# Patient Record
Sex: Female | Born: 1945
Health system: Southern US, Community
[De-identification: ages and names within clinical notes are randomized; demographics above are authoritative.]

## PROBLEM LIST (undated history)

## (undated) DIAGNOSIS — E119 Type 2 diabetes mellitus without complications: Secondary | ICD-10-CM

## (undated) DIAGNOSIS — F419 Anxiety disorder, unspecified: Secondary | ICD-10-CM

## (undated) DIAGNOSIS — IMO0002 Reserved for concepts with insufficient information to code with codable children: Secondary | ICD-10-CM

## (undated) DIAGNOSIS — I10 Essential (primary) hypertension: Secondary | ICD-10-CM

## (undated) DIAGNOSIS — K859 Acute pancreatitis without necrosis or infection, unspecified: Secondary | ICD-10-CM

## (undated) DIAGNOSIS — E039 Hypothyroidism, unspecified: Secondary | ICD-10-CM

## (undated) DIAGNOSIS — K219 Gastro-esophageal reflux disease without esophagitis: Secondary | ICD-10-CM

## (undated) DIAGNOSIS — M1990 Unspecified osteoarthritis, unspecified site: Secondary | ICD-10-CM

## (undated) DIAGNOSIS — E785 Hyperlipidemia, unspecified: Secondary | ICD-10-CM

## (undated) HISTORY — DX: Hypothyroidism, unspecified: E03.9

## (undated) HISTORY — DX: Anxiety disorder, unspecified: F41.9

## (undated) HISTORY — DX: Gastro-esophageal reflux disease without esophagitis: K21.9

## (undated) HISTORY — DX: Reserved for concepts with insufficient information to code with codable children: IMO0002

## (undated) HISTORY — PX: COLONOSCOPY: SHX174

## (undated) HISTORY — DX: Unspecified osteoarthritis, unspecified site: M19.90

## (undated) HISTORY — DX: Essential (primary) hypertension: I10

## (undated) HISTORY — DX: Hyperlipidemia, unspecified: E78.5

## (undated) HISTORY — PX: BREAST CYST EXCISION: SHX579

## (undated) HISTORY — PX: POLYPECTOMY: SHX149

## (undated) HISTORY — DX: Acute pancreatitis without necrosis or infection, unspecified: K85.90

## (undated) HISTORY — DX: Type 2 diabetes mellitus without complications: E11.9

---

## 1982-06-08 HISTORY — PX: TOTAL ABDOMINAL HYSTERECTOMY: SHX209

## 1993-06-08 HISTORY — PX: ROTATOR CUFF REPAIR: SHX139

## 1999-06-08 ENCOUNTER — Emergency Department (HOSPITAL_COMMUNITY): Admission: EM | Admit: 1999-06-08 | Discharge: 1999-06-08 | Payer: Self-pay | Admitting: Emergency Medicine

## 1999-07-10 ENCOUNTER — Other Ambulatory Visit: Admission: RE | Admit: 1999-07-10 | Discharge: 1999-07-10 | Payer: Self-pay | Admitting: Gynecology

## 1999-08-06 ENCOUNTER — Encounter: Payer: Self-pay | Admitting: Cardiology

## 1999-08-06 ENCOUNTER — Encounter: Admission: RE | Admit: 1999-08-06 | Discharge: 1999-08-06 | Payer: Self-pay | Admitting: Cardiology

## 2001-06-23 ENCOUNTER — Encounter: Admission: RE | Admit: 2001-06-23 | Discharge: 2001-06-23 | Payer: Self-pay | Admitting: Internal Medicine

## 2001-06-23 ENCOUNTER — Encounter: Payer: Self-pay | Admitting: Internal Medicine

## 2002-03-13 ENCOUNTER — Encounter: Payer: Self-pay | Admitting: Internal Medicine

## 2002-07-06 ENCOUNTER — Encounter: Admission: RE | Admit: 2002-07-06 | Discharge: 2002-10-04 | Payer: Self-pay | Admitting: Internal Medicine

## 2003-10-15 ENCOUNTER — Other Ambulatory Visit: Admission: RE | Admit: 2003-10-15 | Discharge: 2003-10-15 | Payer: Self-pay | Admitting: Gynecology

## 2005-10-21 ENCOUNTER — Ambulatory Visit: Payer: Self-pay | Admitting: Internal Medicine

## 2006-11-08 ENCOUNTER — Ambulatory Visit: Payer: Self-pay | Admitting: Internal Medicine

## 2006-11-18 ENCOUNTER — Ambulatory Visit: Payer: Self-pay | Admitting: Internal Medicine

## 2006-11-18 ENCOUNTER — Encounter: Payer: Self-pay | Admitting: Internal Medicine

## 2006-11-24 ENCOUNTER — Encounter (INDEPENDENT_AMBULATORY_CARE_PROVIDER_SITE_OTHER): Payer: Self-pay | Admitting: *Deleted

## 2006-11-24 ENCOUNTER — Inpatient Hospital Stay (HOSPITAL_COMMUNITY): Admission: EM | Admit: 2006-11-24 | Discharge: 2006-12-01 | Payer: Self-pay | Admitting: Emergency Medicine

## 2006-12-01 ENCOUNTER — Ambulatory Visit: Payer: Self-pay | Admitting: Internal Medicine

## 2006-12-07 ENCOUNTER — Encounter: Payer: Self-pay | Admitting: Internal Medicine

## 2006-12-22 ENCOUNTER — Encounter (INDEPENDENT_AMBULATORY_CARE_PROVIDER_SITE_OTHER): Payer: Self-pay | Admitting: *Deleted

## 2006-12-29 ENCOUNTER — Ambulatory Visit: Payer: Self-pay | Admitting: Internal Medicine

## 2006-12-29 LAB — CONVERTED CEMR LAB: Magnesium: 1.9 mg/dL (ref 1.5–2.5)

## 2006-12-30 ENCOUNTER — Ambulatory Visit: Payer: Self-pay | Admitting: Cardiology

## 2007-04-25 ENCOUNTER — Ambulatory Visit: Payer: Self-pay | Admitting: Internal Medicine

## 2007-04-25 LAB — CONVERTED CEMR LAB
BUN: 12 mg/dL (ref 6–23)
Creatinine, Ser: 0.7 mg/dL (ref 0.4–1.2)

## 2007-04-28 ENCOUNTER — Ambulatory Visit: Payer: Self-pay | Admitting: Cardiology

## 2007-09-21 DIAGNOSIS — Z862 Personal history of diseases of the blood and blood-forming organs and certain disorders involving the immune mechanism: Secondary | ICD-10-CM

## 2007-09-21 DIAGNOSIS — K219 Gastro-esophageal reflux disease without esophagitis: Secondary | ICD-10-CM

## 2007-09-21 DIAGNOSIS — Z8719 Personal history of other diseases of the digestive system: Secondary | ICD-10-CM | POA: Insufficient documentation

## 2007-09-21 DIAGNOSIS — I10 Essential (primary) hypertension: Secondary | ICD-10-CM | POA: Insufficient documentation

## 2007-09-21 DIAGNOSIS — E669 Obesity, unspecified: Secondary | ICD-10-CM

## 2007-09-21 DIAGNOSIS — M353 Polymyalgia rheumatica: Secondary | ICD-10-CM | POA: Insufficient documentation

## 2007-09-21 DIAGNOSIS — Z8639 Personal history of other endocrine, nutritional and metabolic disease: Secondary | ICD-10-CM | POA: Insufficient documentation

## 2007-09-21 DIAGNOSIS — K449 Diaphragmatic hernia without obstruction or gangrene: Secondary | ICD-10-CM | POA: Insufficient documentation

## 2007-09-22 ENCOUNTER — Encounter: Payer: Self-pay | Admitting: Internal Medicine

## 2007-11-18 ENCOUNTER — Telehealth: Payer: Self-pay | Admitting: Internal Medicine

## 2007-11-21 ENCOUNTER — Telehealth: Payer: Self-pay | Admitting: Internal Medicine

## 2007-11-21 DIAGNOSIS — K862 Cyst of pancreas: Secondary | ICD-10-CM

## 2007-11-21 DIAGNOSIS — K863 Pseudocyst of pancreas: Secondary | ICD-10-CM

## 2007-11-22 ENCOUNTER — Ambulatory Visit: Payer: Self-pay | Admitting: Internal Medicine

## 2007-11-22 LAB — CONVERTED CEMR LAB: Creatinine, Ser: 0.8 mg/dL (ref 0.4–1.2)

## 2007-11-24 ENCOUNTER — Ambulatory Visit: Payer: Self-pay | Admitting: Cardiology

## 2008-04-03 ENCOUNTER — Ambulatory Visit (HOSPITAL_COMMUNITY): Admission: RE | Admit: 2008-04-03 | Discharge: 2008-04-03 | Payer: Self-pay | Admitting: Surgery

## 2008-04-03 ENCOUNTER — Encounter (INDEPENDENT_AMBULATORY_CARE_PROVIDER_SITE_OTHER): Payer: Self-pay | Admitting: Surgery

## 2008-11-20 ENCOUNTER — Emergency Department (HOSPITAL_COMMUNITY): Admission: EM | Admit: 2008-11-20 | Discharge: 2008-11-21 | Payer: Self-pay | Admitting: Emergency Medicine

## 2008-11-21 ENCOUNTER — Telehealth: Payer: Self-pay | Admitting: Internal Medicine

## 2008-11-22 ENCOUNTER — Ambulatory Visit: Payer: Self-pay | Admitting: Internal Medicine

## 2008-11-23 ENCOUNTER — Inpatient Hospital Stay (HOSPITAL_COMMUNITY): Admission: AD | Admit: 2008-11-23 | Discharge: 2008-11-25 | Payer: Self-pay | Admitting: Internal Medicine

## 2008-11-23 ENCOUNTER — Telehealth: Payer: Self-pay | Admitting: Internal Medicine

## 2008-11-23 ENCOUNTER — Ambulatory Visit: Payer: Self-pay | Admitting: Gastroenterology

## 2009-01-02 ENCOUNTER — Ambulatory Visit: Payer: Self-pay | Admitting: Internal Medicine

## 2009-01-07 IMAGING — CR DG CHEST 2V
2 series · 2 of 2 positions shown · non-contrast
Comparison: None.

CLINICAL DATA: Pancreatitis, shortness of breath.
 CHEST ? 2 VIEW:

[w chest pa]
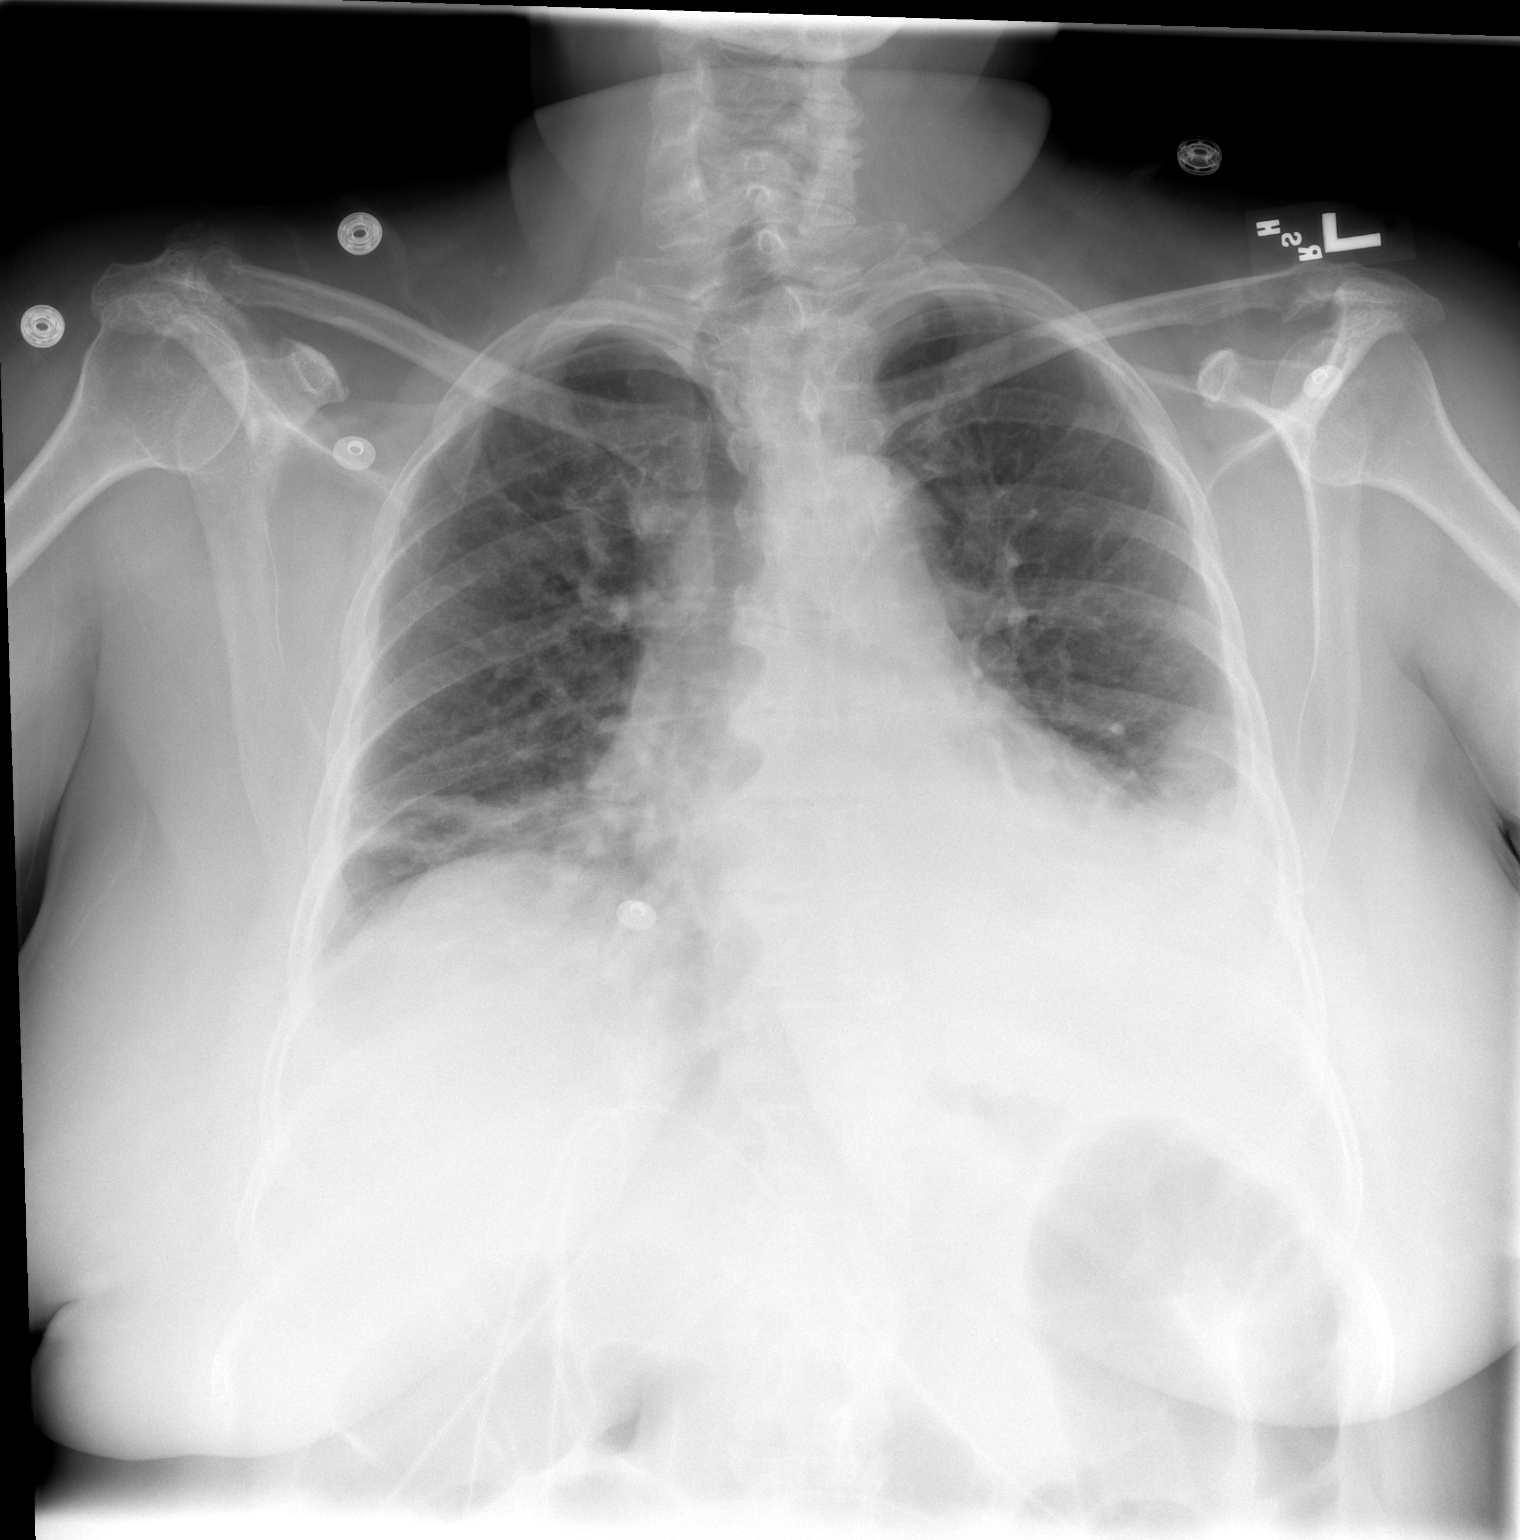

[w chest lat]
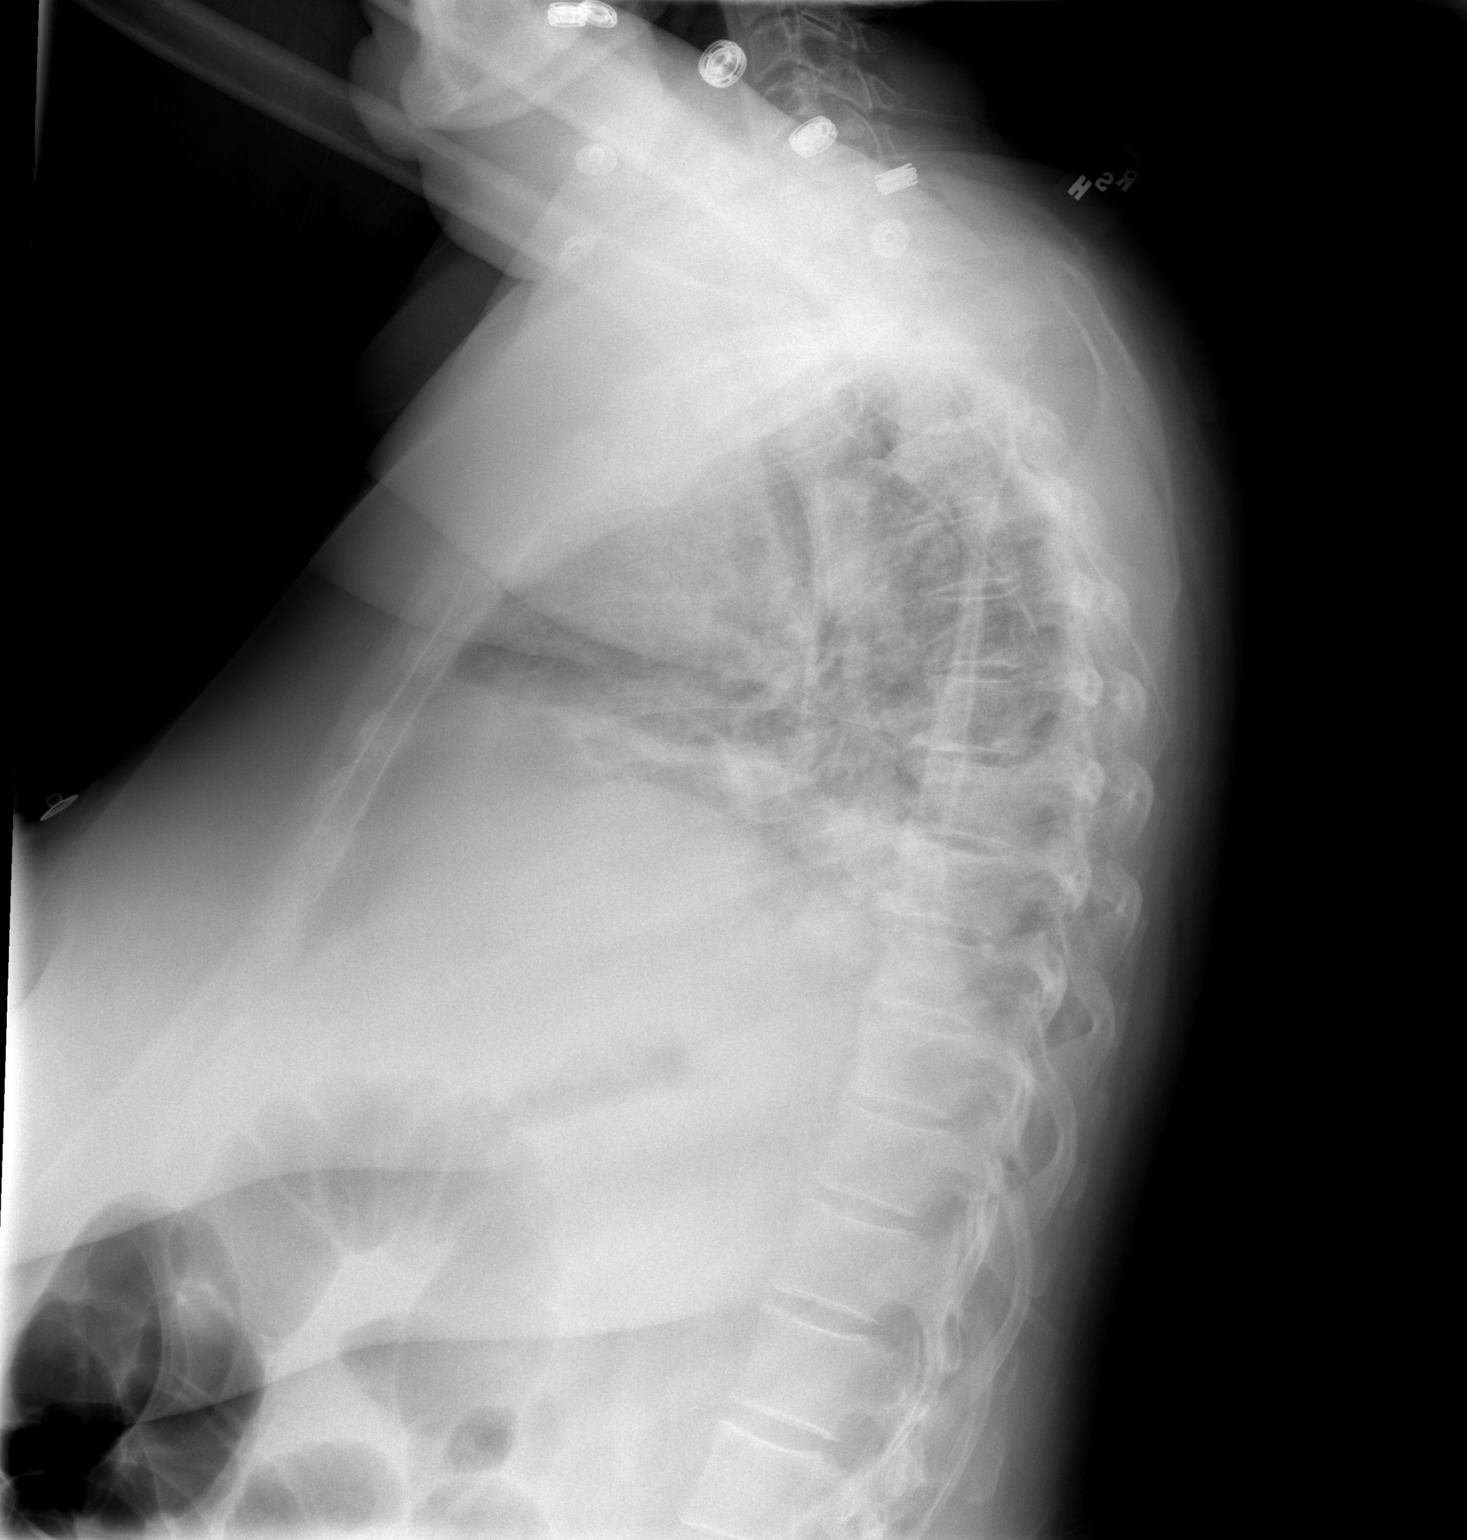

[2 of 2 positions shown; findings below may reference images not displayed]

FINDINGS: Heart size is at the upper limits of normal to mildly enlarged.  The lungs are very low in volume with bibasilar atelectasis and bilateral pleural effusions, left greater than right.
IMPRESSION: Low lung volumes with bibasilar atelectasis and bilateral pleural effusions, left greater than right.

## 2010-09-15 LAB — BASIC METABOLIC PANEL
BUN: 8 mg/dL (ref 6–23)
CO2: 31 mEq/L (ref 19–32)
Calcium: 9.8 mg/dL (ref 8.4–10.5)
Chloride: 101 mEq/L (ref 96–112)
Creatinine, Ser: 0.65 mg/dL (ref 0.4–1.2)
GFR calc Af Amer: >60 mL/min (ref >60)

## 2010-09-15 LAB — CBC
MCHC: 34.7 g/dL (ref 30.0–36.0)
MCV: 95.4 fL (ref 78.0–100.0)
Platelets: 246 10*3/uL (ref 150–400)
Platelets: 268 10*3/uL (ref 150–400)
RBC: 4.53 MIL/uL (ref 3.87–5.11)
RDW: 13.1 % (ref 11.5–15.5)
WBC: 9.3 10*3/uL (ref 4.0–10.5)

## 2010-09-15 LAB — COMPREHENSIVE METABOLIC PANEL
ALT: 12 U/L (ref 0–35)
AST: 14 U/L (ref 0–37)
Albumin: 3.3 g/dL — ABNORMAL LOW (ref 3.5–5.2)
Alkaline Phosphatase: 62 U/L (ref 39–117)
BUN: 8 mg/dL (ref 6–23)
Chloride: 104 mEq/L (ref 96–112)
GFR calc Af Amer: >60 mL/min (ref >60)
Potassium: 3.9 mEq/L (ref 3.5–5.1)
Sodium: 139 mEq/L (ref 135–145)
Total Bilirubin: 0.7 mg/dL (ref 0.3–1.2)
Total Protein: 7 g/dL (ref 6.0–8.3)

## 2010-09-15 LAB — DIFFERENTIAL
Basophils Absolute: 0.2 10*3/uL — ABNORMAL HIGH (ref 0.0–0.1)
Basophils Relative: 1 % (ref 0–1)
Eosinophils Absolute: 0.5 10*3/uL (ref 0.0–0.7)
Neutro Abs: 10.2 10*3/uL — ABNORMAL HIGH (ref 1.7–7.7)
Neutrophils Relative %: 69 % (ref 43–77)

## 2010-09-15 LAB — GLUCOSE, CAPILLARY
Glucose-Capillary: 106 mg/dL — ABNORMAL HIGH (ref 70–99)
Glucose-Capillary: 126 mg/dL — ABNORMAL HIGH (ref 70–99)
Glucose-Capillary: 88 mg/dL (ref 70–99)
Glucose-Capillary: 92 mg/dL (ref 70–99)
Glucose-Capillary: 94 mg/dL (ref 70–99)
Glucose-Capillary: 96 mg/dL (ref 70–99)
Glucose-Capillary: 97 mg/dL (ref 70–99)

## 2010-09-15 LAB — LIPASE, BLOOD: Lipase: 41 U/L (ref 11–59)

## 2010-09-15 LAB — HEMOGLOBIN A1C: Mean Plasma Glucose: 131 mg/dL

## 2010-09-15 LAB — AMYLASE: Amylase: 23 U/L — ABNORMAL LOW (ref 27–131)

## 2010-10-21 NOTE — Op Note (Signed)
NAMECOBY, [name] [name]             ACCOUNT NO.:  [id number]   MEDICAL RECORD NO.:  [id number]          PATIENT TYPE:  AMB   LOCATION:  SDS                          FACILITY:  [facility]   PHYSICIAN:  Thomas A. Cornett, M.D.DATE OF BIRTH:  [birth year]   DATE OF PROCEDURE:  04/03/2008  DATE OF DISCHARGE:  04/03/2008                               OPERATIVE REPORT   PREOPERATIVE DIAGNOSIS:  Left breast mass.   POSTOPERATIVE DIAGNOSIS:  Left breast mass.   ANESTHESIA:  Left breast needle-localized lumpectomy.   SURGEON:  Maisie Fus A. Cornett, MD   ANESTHESIA:  LMA, 0.25% skin local.   ESTIMATED BLOOD LOSS:  10 mL.   SPECIMEN:  Left breast tissue, localizing wire to pathology.   INDICATIONS FOR PROCEDURE:  The patient is a pleasant 65 year old female  with a left breast mass.  She underwent core biopsy of this, which  showed this to be a radial scar, but there was concern that this being  malignancy even with a core biopsy.  We talked about pros and cons of  observing this versus excising it.  The patient wished to have it  removed since it could not be a 100% this would be benign.   DESCRIPTION OF PROCEDURE:  The patient was brought to the operating room  after undergoing left breast needle localization.  After induction of  LMA anesthesia, left breast was prepped and draped in the sterile  fashion.  The wire site was trimmed.  A 0.25% Sensorcaine was injected  around the wire site and a horizontal incision was made on the medial  aspect of the lower quadrant.  Tissue was excised around the wire until  the tip was encountered.  The entire specimen was sent to the  radiologist.  We discussed the findings and since this was a very small  finding on mammography, we felt that we had an adequate biopsy at the  area we had which showed some architectural distortion similar to that  in the mammogram to the pathologist.  I could not feel anything in the  cavity that felt suspicious or worrisome.   The cavity was irrigated and  the wound was closed with a deep layer of 3-0 Vicryl and  subsequent 4-0 Monocryl layer.  Dermabond was applied.  All final counts  of sponge, needle, and instruments were found to be correct at this  portion of the case.  The patient was then awoken and taken to recovery  in satisfactory condition.      Thomas A. Cornett, M.D.  Electronically Signed     TAC/MEDQ  D:  04/03/2008  T:  04/04/2008  Job:  [id number]   cc:   Loraine Leriche A. Perini, M.D.  Dr. Karleen Hampshire

## 2010-10-21 NOTE — H&P (Signed)
[name], [name]             ACCOUNT NO.:  [id number]   MEDICAL RECORD NO.:  [id number]          PATIENT TYPE:  EMS   LOCATION:  MAJO                         FACILITY:  [facility]   PHYSICIAN:  Mark A. Perini, M.D.   DATE OF BIRTH:  1946-05-15   DATE OF ADMISSION:  11/24/2006  DATE OF DISCHARGE:                              HISTORY & PHYSICAL   CHIEF COMPLAINT:  Abdominal pain.   HISTORY OF PRESENT ILLNESS:  [name] is a pleasant 65 year old female  with past medical history as listed below.  She was in her usual state  of health until suddenly at 7:30 this morning, she had an onset of a  swollen feeling in her abdomen.  She had normal bowel movements this  morning.  She then developed severe epigastric and left upper quadrant  abdominal pain.  She took Gas-X with no relief and presented to the  emergency room at approximately noon.  She has had tight abdominal pain  in the midabdomen and left upper quadrant area which have persisted and  are severe.  She denies any nausea or vomiting.  She denies any blood  from above or below.  She denies any fevers or chills.  She denies any  recent gallbladder colic symptoms.  As of late, her polymyalgia  rheumatica symptoms have been fairly good, but she had some significant  right hip pain and is in the process having this evaluated.   She has had no new over-the-counter supplements or unusual medications  recently.   PAST HISTORY:  1. Upper endoscopy 6 days ago showing hiatal hernia and reflux      esophagitis.  2. Hysterectomy in 1984 without oophorectomy.  3. Hypertension since 1992.  4. Hypothyroidism since 1971.  5. Gastroesophageal reflux.  6. G1, P1 parity status.  7. Osteoarthritis.  8. Migraine headaches.  9. Type 2 diabetes diagnosed in the last year.  10.Degenerative joint disease and degenerative disk disease of the low      back with some right sciatica symptoms in the past.  11.Morbid obesity.  12.Polymyalgia rheumatica  diagnosed in February 2008.  13.Dyslipidemia.   ALLERGIES:  TOLECTIN causes a rash, CELEBREX caused GI upset, CRESTOR  caused bloating and cramps and ZOCOR caused muscle aches.   CURRENT MEDICATIONS:  1. Omeprazole 20 mg daily.  2. Premarin 0.625 mg daily.  3. Levoxyl 88 mcg daily.  4. Triamterene/hydrochlorothiazide 37.5/25 one daily.  5. Atenolol 50 mg daily.  6. Tylenol as needed.  7. Aspirin 81 mg daily.  8. Multivitamin daily.  9. Tramadol 50 mg occasionally.  10.Prednisone 5 mg twice a day.  11.Pravachol 40 mg nightly.  12.Fosamax 70 mg each week.  She had her first dose of Fosamax in the      last week.  13.Mirapex each evening.   SOCIAL HISTORY:  She had been widowed since 72.  She has 1 daughter,  [name] [name], at the bedside.  She had 3 grandson.  She works at [employer].  There  is no tobacco history.  She has no alcohol use and no drug use.   FAMILY HISTORY:  Father  died at age 20 of coronary disease and had a  bypass.  Mother is alive at age 40 and has a history of coronary  disease.  She is 2 brothers and a sister who are all living.  There is a  family history of diabetes and coronary disease as well.   REVIEW OF SYSTEMS:  As per the history of present illness.   PHYSICAL EXAM:  VITAL SIGNS:  Temperature 97.5, blood pressure 154/76,  pulse is 75, respiratory rate is 18 with 93% to 96% saturation on room  air.  GENERAL:  She is in no acute distress.  She is in pain.  She is alert  and oriented x4.  She is appropriate, obese.  SKIN:  There is no pallor or icterus.  LUNGS:  Clear to auscultation bilaterally with no wheezes, rales or  rhonchi.  HEART:  Regular rate and rhythm with no murmur, rub or gallop.  ABDOMEN:  Decreased bowel sounds.  There is tenderness in the epigastric  area with some mild guarding.  There is no rebound tenderness.  There is  no hepatosplenomegaly noted.  She is obese.  There is no edema.  She  moves extremities x4.   LABORATORY DATA:  CT  scan of the abdomen shows an inflammatory process  of the retroperitoneum most consistent with pancreatitis.  There is  inflammation and small pockets of fluid around the mid to distal  pancreas.  There is no necrosis and no abscess.  There is a mild small  bowel ileus at the jejunal level.  There is no free air.  There is  bibasilar atelectasis and scarring.  There is no definitive gallstone or  gallbladder disease or ductal dilatation seen on the CT scan.  Acute  abdominal series has a nonspecific bowel gas pattern.   White count is 17.4 with 89% segs, 9% lymphocytes, 2% monocytes;  hemoglobin is 14.2, platelet count 311,000.  Urinalysis is yellow and  hazy, 1.017 specific gravity, pH is 5.5 and there is moderate blood;  otherwise, dipstick is negative.  On the microscopic, there are 0-2  white cells, 0-2 red cells.  Lipase is 279.  Sodium 135, potassium 4.0,  chloride 100, CO2 25, BUN 11, creatinine 0.61, glucose 218, calcium 9.0,  albumin 3.2, total protein 6.5, AST 25, ALT 24.   ASSESSMENT AND PLAN:  Acute pancreatitis.  There is no alcohol history.  I suspect she may have passed a gallstone.  She is a obese, middle-aged  and has been on long-term hormone replacement therapy.  We will consult  Gastroenterology.  We will admit to a telemetry bed.  We will treat with  Lovenox for deep venous thrombosis prophylaxis.  We will treat with  empiric Zosyn, given her elevated white count.  We will hydrate with  normal saline.  We will give morphine as needed.  We will give her  intravenous corticosteroids and intravenous thyroid  replacement.  We will defer other workup to Gastroenterology; however,  we will add on an LDH level and an abdominal ultrasound to look more  closely for gallstones and to look at the bile ducts more carefully.  Current status is satisfactory.  She is a full code status.           ______________________________ Redge Gainer Waynard Edwards, M.D.     MAP/MEDQ  D:   11/24/2006  T:  11/25/2006  Job:  [id number]   cc:   Hedwig Morton. Juanda Chance, MD  Demetria Pore. Coral Spikes, M.D.

## 2010-10-21 NOTE — H&P (Signed)
NAMEKIELI, [name]             ACCOUNT NO.:  [id number]   MEDICAL RECORD NO.:  [id number]          PATIENT TYPE:  INP   LOCATION:  5501                         FACILITY:  [facility]   PHYSICIAN:  Rachael Fee, MD   DATE OF BIRTH:  February 05, 1946   DATE OF ADMISSION:  11/23/2008  DATE OF DISCHARGE:                              HISTORY & PHYSICAL   REASON FOR ADMISSION:  Abdominal pain and CT scan showing pancreatitis.   HISTORY OF PRESENT ILLNESS:  [name] is a 65 year old white  female.  In June 2008, she had an episode of acute pancreatitis that was  felt secondary to hydrochlorothiazide.  At that time, CT as well as  ultrasound imaging did not show any biliary ductal or gallbladder  pathology.  Followup CT of July 2008 and again in November 2008 did show  a small pseudocyst, but this was resolving by the time of the bladder CT  scan.   Since Monday, November 19, 2008, the patient has been experiencing pain in  the epigastrium and left upper quadrant pain.  It progressed over the  next 24 hours and she went to the emergency room on Tuesday evening  where she received relief from a GI cocktail and a 2 mg dose of  Dilaudid.  Labs did not include liver tests, but her basic metabolic  panel, lipase were both normal.  White blood cell count was somewhat  elevated at 14.5.  The patient was released with a prescription for  Percocet as needed and Carafate four times daily.  The patient's  symptoms have progressed.  She is getting slight relief for brief  periods of time from the Percocet, but overall she is not feeling well.  She has, however, not had any nausea or vomiting nor she had any fevers.  She was in contact with Dr. Regino Schultze office and had a CT scan done as an  outpatient yesterday.  This is showing some mild pancreatitis at the  uncinate process and head of the pancreas.  Because her symptoms were  not improving, it was appropriate to have her admitted for supportive  care  to the hospital here at [facility].   Notably the patient takes three medications which are listed as  potential causes of pancreatitis.  These include omeprazole, benazepril,  and pravastatin.  The pravastatin is more suspicious because about 3  weeks ago, during her routine office visit with Dr. Waynard Edwards, the  pravastatin was increased from 40 mg daily to 80 mg daily.   PAST MEDICAL HISTORY:  1. Gastroesophageal reflux disease.  2. Hypertension.  3. Hypothyroidism.  4. Acute pancreatitis, felt secondary to hydrochlorothiazide in June      2008.  She did have a pseudocyst measuring 1.2 cm in July 2010, it      was decreasing in size by November 2010.  5. Morbid obesity.  6. Fatty liver.  7. Ileus at the time of the pancreatitis.  8. Benign breast mass.  She had a lumpectomy in October 2009.  The      pathology showed focal atypical lobular hyperplasia.  9. Migraine  headaches.  10.Osteoarthritis.  11.Osteoporosis.  12.Polymyalgia rheumatica.  13.Dyslipidemia.  14.Status post right rotator cuff repair remotely.  15.Status post hysterectomy, her ovaries are intact.  16.Gravida 1, para 1.   CURRENT MEDICATIONS:  1. Aspirin 81 mg p.o. daily.  2. Omeprazole 20 mg p.o. daily.  3. Atenolol 50 mg p.o. daily.  4. Benazepril 20 mg p.o. daily.  5. Levothyroxine 88 mcg p.o. daily.  6. Pravastatin 80 mg p.o. daily.  7. Multivitamin once p.o. daily.  8. Aleve as needed.  She takes anywhere from 2-4 pills a week of this.  9. Tylenol Extra Strength up to four times daily.  10.Carafate 1 g p.o. q.i.d., this was just started a couple of days      ago.  11.Percocet 5/325 mg one p.o. q.4-6 h. p.r.n. pain.  This was just      prescribed 2 days ago.   ALLERGIES:  Several including PENICILLIN and MORPHINE and TOLECTIN, all  of which caused rash.  She is intolerant to CELEBREX which caused GI  upset, to CRESTOR which causes bloating and abdominal cramps.  ZOCOR has  caused muscle aches.   HYDROCHLOROTHIAZIDE was felt to be causative in  her pancreatitis in the past.   FAMILY HISTORY:  Father died young at 4.  He had coronary artery  disease and had undergone CABG.  Her mother is also status post CABG.  She is alive at age 1.  One brother has diabetes mellitus, coronary  artery disease, and has undergone CABG.  Mother also had breast cancer  for which she is status post lumpectomy and has had cataract surgery.   SOCIAL HISTORY:  The patient is a widow.  She does not use tobacco  products, alcohol, or illicit drugs.  She lives in [place].  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   REVIEW OF SYSTEMS:  CONSTITUTIONAL:  The patient says her weight  fluctuates around 15 pounds within the time period of several months.  Her weight for comparison in October 2009 was 94.6 kg.  EYES:  She does  not have cataracts nor glaucoma, does not have blurry vision.  She had a  dilated eye exam within last 6 months.  NEUROLOGIC:  She does get  migraines and had one yesterday.  She treats these with p.r.n. Tylenol.  She denies dizziness or gait disorder.  ENT:  No sinus congestion.  No  mouth sores.  No dental problems.  CARDIOVASCULAR:  No chest pain.  No  palpitations.  No history of coronary artery disease.  PULMONARY:  No  shortness of breath.  No cough.  GI:  If she does not take her  omeprazole, she started to get sour acid belching, but does not get  pyrosis.  She has no dysphagia.  Bowel movement, she had a good bowel  movement on Tuesday and had a small bowel movement today.  GU:  She does  not have any dysuria, urinary frequency, or incontinence.  MUSCULOSKELETAL:  The polymyalgia rheumatica mostly effects the arms and  she does get aches there with some frequency.  DERMATOLOGIC:  No rashes.  No pruritus.  PSYCHIATRIC:  No depression.  No mood swings.  ENDOSCOPY:  Blood sugars are monitored at least once daily and they run as low as  100 and up as high as 150.   She has never been told that she needed to  start on any oral agents.  ID:  The patient denies fever or chills.  HEMATOLOGIC:  The patient has no problems with inappropriate bleeding or  bruising and has never required transfusions.  IMMUNE/ALLERGIES:  The  patient had a 2009 seasonal flu shot.  She is not clear when she last  had a tetanus, she thinks it may have been at least 20 years ago.  All  other reviews of systems is negative.  Prior GI studies include  colonoscopy in October 2003.  This was a routine study and was normal.  Upper endoscopy in June 2008, she had a hiatal hernia, grade 0.  Gastroesophageal reflux/esophagitis was noted.   LABORATORY:  Pending labs were today, but the labs from November 21, 2008  showed a lipase of 41.  Glucose of 175.  BUN 8, creatinine 0.6.  Sodium  139, potassium 4.0.  Hemoglobin was 15, hematocrit 43.3, white blood  cell count 14.9, platelet count 268.  MCV 95.  CT scan of the abdomen  and pelvis on November 22, 2008 showed mild pancreatitis at the head and the  uncinate process.  The gallbladder and liver were described as normal.   PHYSICAL EXAMINATION:  GENERAL:  The patient is obese.  She looks well.  She is not in any acute distress.  She is somewhat older white female.  VITAL SIGNS:  Temperature is 96.8, pulse is 63, respirations are 18,  blood pressure 139/70, room air saturation is 96%.  HEENT:  There is no conjunctival pallor.  There is no icterus.  Oral  mucosa is clear and moist.  Her dentition is in good repair.  NECK:  No masses, no JVD.  PULMONARY:  The lungs are clear to auscultation and percussion  bilaterally.  No shortness of breath.  No cough.  CARDIOVASCULAR:  Regular rate and rhythm.  No murmurs, rubs, or gallops  appreciated.  S1 and S2 are audible.  ABDOMEN:  Soft, obese.  Bowel sounds are active.  There is some  tenderness in the left upper quadrant and epigastrium.  No masses, no  guarding, or rebound here.  There is no  hepatosplenomegaly.  EXTREMITIES:  No cyanosis, clubbing, or edema.  DERMATOLOGIC:  No rash.  No jaundice.  NEUROLOGIC:  She is alert and oriented x3.  Strength in the upper and  lower extremities is full.  There is no tremors.   IMPRESSION:  1. Recurrent acute pancreatitis, the patient is stable.  This does not      appear to be a severe case of pancreatitis.  Question if the      pravastatin is the cause as the dose was recently doubled.  Other      possible culprits for medication induced pancreatitis include      benazepril and omeprazole.  On CT scan 2 days ago as well as      ultrasound and CT imaging in 2008, there was no evidence of any      biliary pathology.  2. Diabetes mellitus type 2.  She has a history of elevated blood      sugars, but has never been treated with oral medications.  Says      that her hemoglobin A1c has always been acceptable.  3. Hyperlipidemia.  4. Polymyalgia rheumatica.  Dr. Coral Spikes discontinued her steroids as      of March 2010.  5. Hypertension, controlled.   PLAN:  The patient admitted to inpatient bed for supportive care, sips  of water for now.  We will provide Dilaudid and Phenergan as needed for  her symptoms.  Give her IV fluids.  Provide sliding scale insulin for  p.r.n. use.  Repeat a full set of chemistries, pancreatic enzymes, and  CBC.      Jennye Moccasin, PA-C      Rachael Fee, MD  Electronically Signed    SG/MEDQ  D:  11/23/2008  T:  11/24/2008  Job:  [id number]

## 2010-10-21 NOTE — Assessment & Plan Note (Signed)
[facility]                         GASTROENTEROLOGY OFFICE NOTE   [name] [name], [name] [name]                    MRN:          [id number]  DATE:12/29/2006                            DOB:          August 29, 1945    Ms. [name] [name] is a 65 year old patient of Dr. Laurey Morale who had an acute  pancreatitis requiring hospitalization at [facility].  She was  discharged 2 weeks ago.  Pancreatitis occurs within a week of her having  upper endoscopy for evaluation of distal esophageal reflux disease.  The  etiology of the pancreatitis was never determined.  It was suspected  that it was due to thiazide diuretics.  Her gallbladder appears normal  and she has never had any significant alcohol intake.  Also, her lipids  were not excessively high.  Since the hospitalization, she has done  quite well and has been able to return to work.  She still has  occasional epigastric discomfort and most of her complaints now center  around constipation.  She had normal bowel habits prior to the  hospitalization, but now has somewhat difficult evacuations.   MEDICATIONS:  1. Magnesium 400 mg daily.  2. Potassium 1 daily.  3. Prednisone 5 mg p.o. daily.  4. Atenolol 40 mg p.o. daily.  5. Premarin 0.625 mg p.o. daily.  6. Omeprazole 20 mg p.o. daily.  7. Benazepril 20 mg p.o. daily.  8. Mirapex 5 mg p.o. daily.  9. Pravastatin 20 mg p.o. daily.  10.Baby aspirin 81 mg p.o. daily.  11.Levoxyl 20 mcg daily.   PHYSICAL EXAM:  Blood pressure 164/98, pulse 68, weight not taken.  She was alert and oriented, in no distress, overweight.  Last weight in  our office was 206 pounds.  LUNGS:  Clear to auscultation.  COR:  Normal S1, normal S2.  ABDOMEN:  Obese, soft.  Tenderness in the epigastrium to the left of the  epigastrium.  No fullness or pulsation.  Lower abdomen was unremarkable.  EXTREMITIES:  No edema.   IMPRESSION:  17. A 65 year old white female status post acute pancreatitis  with      pancreatic edema on CT scan.  Might have been attributable to      thiazide diuretics.  She has symptomatically improved.  2. Polymyalgia rheumatica by history.  The patient on low-dose      steroids.  3. High blood pressure.  4. History of hypothyroidism.  5. Obesity.  6. Gastroesophageal reflux disease.   PLAN:  1. Repeat CT scan of the abdomen with pancreatic protocol to rule out      the evolution of pseudo-cyst.  2. Colace 100 mg daily.  3. Check magnesium potassium today.  The patient is interested in      discontinuation of these medications if she does not need it.  4. Continue Prilosec 200 mg daily.  5. Continue low-fat diet.     Hedwig Morton. Juanda Chance, MD  Electronically Signed    DMB/MedQ  DD: 12/29/2006  DT: 12/29/2006  Job #: [id number]   cc:   Loraine Leriche A. Perini, M.D.

## 2010-10-24 NOTE — Discharge Summary (Signed)
NAMEJOSCELYNN, BRUTUS             ACCOUNT NO.:  [id number]   MEDICAL RECORD NO.:  [id number]          PATIENT TYPE:  INP   LOCATION:  4705                         FACILITY:  [facility]   PHYSICIAN:  Mark A. Perini, M.D.   DATE OF BIRTH:  1946/04/03   DATE OF ADMISSION:  11/24/2006  DATE OF DISCHARGE:  12/01/2006                               DISCHARGE SUMMARY   DISCHARGE DIAGNOSIS:  1. Acute pancreatitis felt to possibly be due to thiazide diuretic      use.  2. Ileus, resolving at the time of discharge.  3. Hypertension.  4. Hypothyroidism.  5. Gastroesophageal reflux.  6. Osteoarthritis.  7. Polymyalgia rheumatica on prednisone treatment for the last several      months.  8. Morbid obesity.  9. Dyslipidemia.  10.Type 2 diabetes.   PROCEDURES:  1. Gastroenterology consultation.  2. CT scan of the abdomen and pelvis on the day of admission which      showed inflammatory process of the retroperitoneum, most consistent      with pancreatitis.  There was inflammation and small pockets of      fluid around the mid-to-distal pancreas, no necrosis, no abscess      seen.  There was a mild small-bowel ileus of the jejunal level      noted.  There was no free air.  There was bibasilar atelectasis and      scarring.  No definitive gallstone or gallbladder disease or ductal      dilation was seen.  3. Abdominal ultrasound November 25, 2006 showed no gallstones, fatty      infiltration of the liver.  The pancreas and abdominal aorta were      obscured by bowel gas and body habitus.   DISCHARGE MEDICATIONS:  1. She is to discontinue hydrochlorothiazide and to take no more      triamterene hydrochlorothiazide.  2. She is to resume omeprazole 20 mg daily with food.  3. She may resume Premarin 0.625 mg once daily in 1 week.  4. She is to resume Levoxyl 80 mcg daily.  5. Atenolol 50 mg daily.  6. Tylenol 500 mg 1-2 tablets three times a day as needed.  7. Aspirin 81 mg daily.  8. She is to  stop her multivitamin.  9. She is stop Tramadol.  10.She is to take prednisone 5 mg once daily.  11.Pravachol 40 mg each evening may be resumed in 1 week.  12.In two weeks she may resume Fosamax 70 mg once weekly as directed.  13.It is okay to take Mirapex if needed.  14.She is to have Vicodin 5/500 one q.6 h. as needed for pain.  15.She is to use a stool softener if she uses Vicodin.  16.She is to take magnesium supplement 400 mg daily.  17.Potassium 20 mEq 1 pill daily.   HISTORY OF PRESENT ILLNESS:  Shawan is a pleasant 65 year old female who  presented on November 22, 2006 with acute onset of severe mid-and-upper  abdominal pain.  She was found to have pancreatitis and was admitted.   HOSPITAL COURSE:  Delayla was admitted to a  telemetry bed.  She was  treated with aggressive IV hydration.  She did receive 1 day of IV  antibiotics; and then this was discontinued.  She was treated bowel  rest.  She had significant hypokalemia which required replacement.  She  did have a PICC line placed; and this was removed at the time of  discharge.  She never required a TNA.  Gastroenterology followed along  in her case.  As she had no significant abnormality of her liver  function test, and her gallbladder appeared normal on both CT scan  abdomen, and she has no alcohol history; it was felt that most likely  her pancreatitis was due to thiazide diuretic use.  Therefore, this was  discontinued up.  Ms. Dillavou pain gradually improved; and by December 01, 2006 she was deemed stable for discharge home.   DISCHARGE PHYSICAL EXAM:  Temperature 98.1.  She was afebrile, pulse 63,  respiratory 18, blood pressure 173/84 and 131/82.  Blood sugars ranged  from 137-227.  Oxygen saturation 96% on room air.  Weight was 101.5 kg.  She was in no acute distress.  Lungs were clear to auscultation  bilaterally with no wheezes, rales, or rhonchi.  Heart was regular,  rate, and rhythm with no murmur, rub, or gallop.   Abdomen had positive  bowel sounds.  It was soft; and there was very mild epigastric  tenderness noted.  There was no rebound or guarding.  There was no  peripheral edema.   DISCHARGE LABORATORY DATA:  White count 12.1 with 60% segs, 31%  lymphocytes, 6% monocytes, hemoglobin 11.4, platelet count 307,000,  sodium 142, potassium 3.0, chloride 105, CO2 29, BUN 4, creatinine 0.64,  glucose 126, albumin 2.2 with other liver tests normal.   DISCHARGE INSTRUCTIONS:  Areej is to follow a bland, diabetic, low-salt  diet.  She is to increase her activity slowly.  She is to call if she  has any problems.  She is to followup in 7 days with Dr. Waynard Edwards; and she  is return to [facility] medical lab in 2 days for a followup CBC and BMET.  She is to check her blood sugars twice a day before meals; and keep a  record of this; and call us if her sugars are significantly over 180-200  consistently.  She is to follow up with [facility] gastroenterology; and  she is to call for a visit in the next 3-4 weeks.           ______________________________  Redge Gainer Waynard Edwards, M.D.     MAP/MEDQ  D:  12/07/2006  T:  12/07/2006  Job:  [id number]   cc:   Hedwig Morton. Juanda Chance, MD

## 2010-10-24 NOTE — Discharge Summary (Signed)
[name] [name], [name]             ACCOUNT NO.:  [id number]   MEDICAL RECORD NO.:  [id number]          PATIENT TYPE:  INP   LOCATION:  5501                         FACILITY:  [facility]   PHYSICIAN:  Rachael Fee, MD   DATE OF BIRTH:  July 22, 1945   DATE OF ADMISSION:  11/23/2008  DATE OF DISCHARGE:  11/25/2008                               DISCHARGE SUMMARY   ADMITTING DIAGNOSES:  1. Recurrent acute pancreatitis.  The patient is stable.  Suspicion      that recent increase in pravastatin dose may have led to      pancreatitis.  Other medication culprits for pancreatitis include      benazepril and omeprazole which the patient is taking.  2. Type 2 diabetes mellitus, non-insulin requiring  3. Hyperlipidemia.  4. Polymyalgia rheumatica.  She has not been on steroid therapy since      March 2010.  5. Controlled hypertension.  6. Hypothyroidism.  7. History of acute pancreatitis in June 2008.  Etiology was felt to      be hydrochlorothiazide.  She had a pseudocyst at that time.  8. Morbid obesity.  9. Fatty liver.  10.Ileus associated with the 2008 bout of pancreatitis.  11.Status post lumpectomy, October 2009.  Pathology showing focal      atypical lobular hyperplasia.  12.Migraine headaches.  13.Osteoarthritis.  14.Osteoporosis.  15.Dyslipidemia.  16.Status post right rotator cuff repair.  17.Status post hysterectomy, ovaries are intact.  18.Gravida 1, para 1.   Allergies to PENICILLIN, MORPHINE, TOLECTIN, CELEBREX, CRESTOR,  HYDROCHLOROTHIAZIDE.  Some of these are true allergies.  She developed  rash from penicillin, morphine, and Tolectin.  Crestor caused bloating  and abdominal cramps.  Zocor called muscle aches and Celebrex caused GI  upset.  Hydrochlorothiazide was felt to be the cause for 2008 bout with  pancreatitis.   DISCHARGE DIAGNOSES:  1. Mild bout of acute pancreatitis.  Amylase and lipase normal, and      symptom of abdominal pain, resolving rapidly.  Question  as to      whether this was secondary to the recent increased dose of her      pravastatin.  2. Diabetes mellitus type 2.  Hemoglobin A1c measurements suggest fair      but chronic management of her blood sugars.  Blood sugar is fairly      well controlled here in the hospital.   PROCEDURE:  None.   HOSPITAL CONSULTS:  The patient was seen briefly by her primary care  doctor, Dr. Rodrigo Ran.  He agreed that the pravastatin should be  discontinued for this patient.   LABORATORY:  Hemoglobin 13.3, hematocrit 38.7.  White blood cell count  9.3.  MCV 96.8.  Platelets 246.  Sodium 139, potassium 3.9.  Chloride  104, CO2 29.  Glucose 97.  Creatinine 0.69.  Total bilirubin 7.0,  alkaline phosphatase 62, AST 14, ALT 12.  The amylase was 23 and the  lipase was 13.  Hemoglobin A1c 6.2 that representing an estimated  average glucose of 131.  The reference range for A1c is 4.6-6.1.  CBGs  ranging anywhere from  87-106.   IMAGING STUDIES:  CT scan of the abdomen without oral contrast showed  pancreatitis at the head and uncinate process.   BRIEF HISTORY:  [name] [name] is a pleasant 65 year old white female who  had prior acute pancreatitis that resolved in June 2008.  Last previous  imaging in November 2008 showed a small pseudocyst.  She had a  subsequent bladder scan in June 2009, but said that the pseudocyst was  virtually resolved and just measured about 7 x 4 mm at that time.   The patient presented to the emergency room on November 23, 2008.  She had  about 4 days of epigastric and left upper quadrant pain.  This had began  on November 19, 2008, she went to the emergency room on November 20, 2008, and  had relief after being given a GI cocktail and some Dilaudid.  Low labs  were obtained, and the lipase and BMET were normal.  She did not have  LFTs obtained.  White count was somewhat elevated at 14.5.  She was  given a prescription for Percocet and Carafate 4 times daily to be added  to her  numerous other medications which did include omeprazole and once  a day aspirin.  Since getting home, the patient has had recurrence of  the pain and it is not getting any better except for some brief relief  with the use of Percocet.  She denied nausea or vomiting.  She called  Dr. Juanda Chance and a outpatient CT scan was arranged showing some mild  pancreatitis.  Because of the non-improving symptoms, she was told to  come to the emergency room and was admitted to the hospital by Dr.  Rob Bunting.   Interesting to the history are the fact that she takes several  medications that are listed as potential causes for pancreatitis.  These  include omeprazole, benazepril, and pravastatin.  Pravastatin was  especially suspicious because it had been increased from 40-80 mg daily  3 weeks ago by her primary care physician.   HOSPITAL COURSE:  The patient was admitted and continued on IV fluids.  She was allowed a diet of clear liquids, and the diet was advanced to  low-fat diabetic-type diet and this she tolerated all levels of p.o.'s  well.  She was hospitalized for approximately 48 hours, and during that  time she had resolution of the abdominal pain.  She did not require  significant use of available Dilaudid.   The patient was followed over the weekend by Dr. Jeani Hawking, and she  was having no further significant pain and was discharged home in stable  condition.  She was advised to follow a diabetic diet.  The patient was  advised to continue all of her previous medications with the exception  of pravastatin.  Thus, her medications at discharge were:  1. Aspirin 81 mg daily.  2. Omeprazole 20 mg daily.  3. Atenolol 50 mg daily.  4. Benazepril 20 mg daily.  5. levothyroxine 88 mcg daily.  6. Multivitamins once daily.  7. Aleve p.r.n.  Of note, she takes about 2-4 of these pills weekly.  8. Tylenol Extra Strength up to 4 times daily.  9. Percocet 5/325 one p.o. q.4-6 h. p.r.n.  10.Note  that she had been started on Carafate just a couple of days      prior to admission and it was not necessary to continue this      particular medication.   She was to  follow up with Dr. Juanda Chance on January 02, 2009.      Jennye Moccasin, PA-C      Rachael Fee, MD  Electronically Signed    SG/MEDQ  D:  02/05/2009  T:  02/06/2009  Job:  [id number]   cc:   Loraine Leriche A. Perini, M.D.  Demetria Pore. Coral Spikes, M.D.  Hedwig Morton. Juanda Chance, MD

## 2011-03-09 LAB — BASIC METABOLIC PANEL
BUN: 10
GFR calc non Af Amer: >60
Glucose, Bld: 117 — ABNORMAL HIGH
Potassium: 3.8

## 2011-03-09 LAB — CBC
HCT: 43
Platelets: 253
RDW: 12.8

## 2011-03-09 LAB — DIFFERENTIAL
Basophils Absolute: 0
Eosinophils Absolute: 0.3
Eosinophils Relative: 5
Lymphocytes Relative: 41

## 2011-03-09 LAB — GLUCOSE, CAPILLARY: Glucose-Capillary: 114 — ABNORMAL HIGH

## 2011-03-25 LAB — HEPATIC FUNCTION PANEL
ALT: 17
AST: 18
AST: 20
Albumin: 2.3 — ABNORMAL LOW
Albumin: 2.6 — ABNORMAL LOW
Albumin: 3 — ABNORMAL LOW
Alkaline Phosphatase: 55
Alkaline Phosphatase: 58
Bilirubin, Direct: 0.1
Bilirubin, Direct: 0.2
Bilirubin, Direct: 0.2
Indirect Bilirubin: 0.5
Indirect Bilirubin: 1.1 — ABNORMAL HIGH
Total Bilirubin: 0.7
Total Bilirubin: 1.2
Total Protein: 5.9 — ABNORMAL LOW

## 2011-03-25 LAB — DIFFERENTIAL
Basophils Absolute: 0
Basophils Absolute: 0
Basophils Absolute: 0
Basophils Absolute: 0
Basophils Relative: 0
Basophils Relative: 0
Basophils Relative: 0
Basophils Relative: 0
Basophils Relative: 0
Basophils Relative: 0
Eosinophils Absolute: 0
Eosinophils Absolute: 0
Eosinophils Absolute: 0
Eosinophils Absolute: 0
Eosinophils Absolute: 0.1
Eosinophils Absolute: 0.3
Eosinophils Relative: 0
Eosinophils Relative: 1
Eosinophils Relative: 3
Lymphocytes Relative: 6 — ABNORMAL LOW
Lymphocytes Relative: 6 — ABNORMAL LOW
Lymphs Abs: 1
Lymphs Abs: 1.5
Lymphs Abs: 1.9
Lymphs Abs: 3.8 — ABNORMAL HIGH
Monocytes Absolute: 0.4
Monocytes Absolute: 0.6
Monocytes Absolute: 0.7
Monocytes Absolute: 1.2 — ABNORMAL HIGH
Monocytes Relative: 1 — ABNORMAL LOW
Monocytes Relative: 2 — ABNORMAL LOW
Monocytes Relative: 4
Monocytes Relative: 4
Monocytes Relative: 6
Neutro Abs: 10.1 — ABNORMAL HIGH
Neutro Abs: 13.8 — ABNORMAL HIGH
Neutro Abs: 17.5 — ABNORMAL HIGH
Neutrophils Relative %: 79 — ABNORMAL HIGH
Neutrophils Relative %: 88 — ABNORMAL HIGH
Neutrophils Relative %: 89 — ABNORMAL HIGH

## 2011-03-25 LAB — CBC
HCT: 33.7 — ABNORMAL LOW
HCT: 34.1 — ABNORMAL LOW
HCT: 36.3
HCT: 36.5
Hemoglobin: 11.6 — ABNORMAL LOW
Hemoglobin: 11.8 — ABNORMAL LOW
Hemoglobin: 12.4
Hemoglobin: 12.6
MCHC: 33.9
MCHC: 34.5
MCHC: 34.6
MCV: 95.8
MCV: 96
Platelets: 311
Platelets: 325
RBC: 3.52 — ABNORMAL LOW
RBC: 3.55 — ABNORMAL LOW
RBC: 3.57 — ABNORMAL LOW
RBC: 3.78 — ABNORMAL LOW
RBC: 4.32
RDW: 13.1
RDW: 13.2
RDW: 13.2
RDW: 13.3
RDW: 13.4
WBC: 11.5 — ABNORMAL HIGH
WBC: 13.9 — ABNORMAL HIGH
WBC: 15.4 — ABNORMAL HIGH

## 2011-03-25 LAB — BASIC METABOLIC PANEL
BUN: 5 — ABNORMAL LOW
CO2: 27
CO2: 29
Calcium: 8 — ABNORMAL LOW
Calcium: 8.3 — ABNORMAL LOW
Chloride: 101
Chloride: 105
Creatinine, Ser: 0.64
Creatinine, Ser: 0.75
GFR calc Af Amer: >60
GFR calc Af Amer: >60
GFR calc Af Amer: >60
GFR calc non Af Amer: >60
GFR calc non Af Amer: >60
Glucose, Bld: 136 — ABNORMAL HIGH
Glucose, Bld: 152 — ABNORMAL HIGH
Glucose, Bld: 160 — ABNORMAL HIGH
Potassium: 3 — ABNORMAL LOW
Potassium: 3.1 — ABNORMAL LOW
Sodium: 135
Sodium: 139
Sodium: 139
Sodium: 140

## 2011-03-25 LAB — COMPREHENSIVE METABOLIC PANEL
ALT: 16
ALT: 20
ALT: 24
AST: 22
Albumin: 3.2 — ABNORMAL LOW
Alkaline Phosphatase: 49
Alkaline Phosphatase: 53
Alkaline Phosphatase: 58
BUN: 5 — ABNORMAL LOW
CO2: 28
CO2: 29
Calcium: 8.3 — ABNORMAL LOW
Chloride: 107
GFR calc Af Amer: >60
Glucose, Bld: 169 — ABNORMAL HIGH
Potassium: 3 — ABNORMAL LOW
Potassium: 3.8
Potassium: 4
Sodium: 135
Sodium: 142
Sodium: 143
Total Bilirubin: 0.5
Total Protein: 4.9 — ABNORMAL LOW
Total Protein: 5 — ABNORMAL LOW
Total Protein: 6.5

## 2011-03-25 LAB — URINE MICROSCOPIC-ADD ON

## 2011-03-25 LAB — URINALYSIS, ROUTINE W REFLEX MICROSCOPIC
Glucose, UA: NEGATIVE
Ketones, ur: NEGATIVE
Protein, ur: NEGATIVE

## 2011-03-25 LAB — CULTURE, BLOOD (ROUTINE X 2)

## 2011-03-25 LAB — LIPASE, BLOOD
Lipase: 11
Lipase: 13

## 2011-03-25 LAB — LIPID PANEL
Cholesterol: 164
Triglycerides: 150 — ABNORMAL HIGH

## 2011-03-25 LAB — AMYLASE: Amylase: 17 — ABNORMAL LOW

## 2011-03-25 LAB — HEMOGLOBIN A1C: Hgb A1c MFr Bld: 7.3 — ABNORMAL HIGH

## 2012-01-26 ENCOUNTER — Encounter: Payer: Self-pay | Admitting: Internal Medicine

## 2012-04-06 ENCOUNTER — Ambulatory Visit
Admission: RE | Admit: 2012-04-06 | Discharge: 2012-04-06 | Disposition: A | Discharge status: Home / Self Care | Payer: Self-pay | Source: Ambulatory Visit | Attending: Internal Medicine | Admitting: Internal Medicine

## 2012-04-06 ENCOUNTER — Other Ambulatory Visit: Payer: Self-pay | Admitting: Internal Medicine

## 2012-04-06 DIAGNOSIS — M545 Low back pain: Secondary | ICD-10-CM

## 2012-09-05 DIAGNOSIS — G2581 Restless legs syndrome: Secondary | ICD-10-CM | POA: Diagnosis not present

## 2012-09-05 DIAGNOSIS — E039 Hypothyroidism, unspecified: Secondary | ICD-10-CM | POA: Diagnosis not present

## 2012-09-05 DIAGNOSIS — M5137 Other intervertebral disc degeneration, lumbosacral region: Secondary | ICD-10-CM | POA: Diagnosis not present

## 2012-09-05 DIAGNOSIS — E119 Type 2 diabetes mellitus without complications: Secondary | ICD-10-CM | POA: Diagnosis not present

## 2012-09-05 DIAGNOSIS — E669 Obesity, unspecified: Secondary | ICD-10-CM | POA: Diagnosis not present

## 2012-09-05 DIAGNOSIS — I10 Essential (primary) hypertension: Secondary | ICD-10-CM | POA: Diagnosis not present

## 2012-09-07 DIAGNOSIS — M47817 Spondylosis without myelopathy or radiculopathy, lumbosacral region: Secondary | ICD-10-CM | POA: Diagnosis not present

## 2012-09-30 ENCOUNTER — Encounter: Payer: Self-pay | Admitting: Internal Medicine

## 2013-01-25 DIAGNOSIS — E119 Type 2 diabetes mellitus without complications: Secondary | ICD-10-CM | POA: Diagnosis not present

## 2013-01-26 DIAGNOSIS — Z1289 Encounter for screening for malignant neoplasm of other sites: Secondary | ICD-10-CM | POA: Diagnosis not present

## 2013-01-26 DIAGNOSIS — Z1212 Encounter for screening for malignant neoplasm of rectum: Secondary | ICD-10-CM | POA: Diagnosis not present

## 2013-01-30 DIAGNOSIS — R1032 Left lower quadrant pain: Secondary | ICD-10-CM | POA: Diagnosis not present

## 2013-02-02 DIAGNOSIS — IMO0002 Reserved for concepts with insufficient information to code with codable children: Secondary | ICD-10-CM | POA: Diagnosis not present

## 2013-02-02 DIAGNOSIS — M545 Low back pain: Secondary | ICD-10-CM | POA: Diagnosis not present

## 2013-02-08 DIAGNOSIS — M545 Low back pain: Secondary | ICD-10-CM | POA: Diagnosis not present

## 2013-02-18 DIAGNOSIS — M542 Cervicalgia: Secondary | ICD-10-CM | POA: Diagnosis not present

## 2013-02-18 DIAGNOSIS — M5126 Other intervertebral disc displacement, lumbar region: Secondary | ICD-10-CM | POA: Diagnosis not present

## 2013-02-18 DIAGNOSIS — M545 Low back pain: Secondary | ICD-10-CM | POA: Diagnosis not present

## 2013-02-18 DIAGNOSIS — M48061 Spinal stenosis, lumbar region without neurogenic claudication: Secondary | ICD-10-CM | POA: Diagnosis not present

## 2013-03-01 ENCOUNTER — Encounter: Payer: Self-pay | Admitting: Internal Medicine

## 2013-03-01 ENCOUNTER — Ambulatory Visit (INDEPENDENT_AMBULATORY_CARE_PROVIDER_SITE_OTHER): Payer: Medicare Other | Admitting: Internal Medicine

## 2013-03-01 VITALS — BP 158/88 | HR 52 | Ht 61.5 in | Wt 191.8 lb

## 2013-03-01 DIAGNOSIS — Z01810 Encounter for preprocedural cardiovascular examination: Secondary | ICD-10-CM | POA: Insufficient documentation

## 2013-03-01 DIAGNOSIS — E785 Hyperlipidemia, unspecified: Secondary | ICD-10-CM

## 2013-03-01 DIAGNOSIS — E119 Type 2 diabetes mellitus without complications: Secondary | ICD-10-CM | POA: Diagnosis not present

## 2013-03-01 DIAGNOSIS — I10 Essential (primary) hypertension: Secondary | ICD-10-CM

## 2013-03-01 DIAGNOSIS — M5430 Sciatica, unspecified side: Secondary | ICD-10-CM

## 2013-03-01 DIAGNOSIS — M5442 Lumbago with sciatica, left side: Secondary | ICD-10-CM

## 2013-03-01 DIAGNOSIS — E669 Obesity, unspecified: Secondary | ICD-10-CM

## 2013-03-01 NOTE — Progress Notes (Signed)
OFFICE NOTE  Chief Complaint:  Preoperative cardiac risk assessment  Primary Care Physician: Ezequiel Kayser, MD  HPI:  ARLYNE BRANDES is a pleasant 67 year old female with a history of hypertension, dyslipidemia, diabetes type 2 and obesity. There is also a strong family history of heart disease, especially in her father who had an MI and bypass surgery in his 61s and died at age 65. She is personally been struggling with low back pain for some time and has undergone chiropractic therapy as well as injections in the back without relief. She is now contemplating surgery for "bulging" lumbar discs by Dr. Ciro Backer. She is referred to me for preoperative cardiac risk assessment. Mrs. Foskey has no cardiovascular complaints that I could elicit. Despite her low back pain, she is fairly active and push mows her lawn every week which takes about an hour and half. She also does one to 2 mile walks, but is limited by back pain, but there is no associated shortness of breath, chest pain or limiting cardiovascular condition. She is able to walk upstairs without significant shortness of breath or chest pressure. These activities constitute more than 4 metabolic equivalents of workload.   PMHx:  Past Medical History  Diagnosis Date  . Hypertension   . Hypothyroidism   . GERD (gastroesophageal reflux disease)   . DM type 2 (diabetes mellitus, type 2)   . DJD (degenerative joint disease)   . DDD (degenerative disc disease)   . Pancreatitis     h/o, twice    Past Surgical History  Procedure Laterality Date  . Rotator cuff repair  1995    right  . Total abdominal hysterectomy  1984  . Breast cyst excision      left, benign     FAMHx:  Family History  Problem Relation Age of Onset  . Dementia Mother     age 89 in 2014, heart disease  . Heart disease Father     CAD, CABG  . Heart disease Brother     HTN, CBAG  . Hypertension Brother   . Fibromyalgia Sister     SOCHx:   reports that  she has never smoked. She has never used smokeless tobacco. She reports that she does not drink alcohol or use illicit drugs.  ALLERGIES:  Allergies  Allergen Reactions  . Penicillins Itching    ROS: A comprehensive review of systems was negative except for: Musculoskeletal: positive for back pain and leg pain Endocrine: positive for hypothyroidism  HOME MEDS: Current Outpatient Prescriptions  Medication Sig Dispense Refill  . aspirin 81 MG tablet Take 81 mg by mouth daily.      Marland Kitchen atenolol (TENORMIN) 50 MG tablet Take 50 mg by mouth daily.      . benazepril (LOTENSIN) 20 MG tablet Take 20 mg by mouth daily.      Marland Kitchen estradiol (ESTRACE) 0.5 MG tablet Take 1 tablet by mouth daily.      Marland Kitchen gemfibrozil (LOPID) 600 MG tablet Take 2 tablets by mouth daily.      Marland Kitchen HYDROcodone-acetaminophen (NORCO) 10-325 MG per tablet as needed.      Marland Kitchen levothyroxine (SYNTHROID, LEVOTHROID) 88 MCG tablet Take 88 mcg by mouth daily before breakfast.      . metFORMIN (GLUCOPHAGE-XR) 500 MG 24 hr tablet Take 1 tablet by mouth 2 (two) times daily.      . Multiple Vitamin (MULTIVITAMIN) capsule Take 1 capsule by mouth daily.      Marland Kitchen omeprazole (PRILOSEC) 20 MG  capsule Take 20 mg by mouth daily.       No current facility-administered medications for this visit.    LABS/IMAGING: No results found for this or any previous visit (from the past 48 hour(s)). No results found.  VITALS: BP 158/88  Pulse 52  Ht 5' 1.5" (1.562 m)  Wt 191 lb 12.8 oz (87 kg)  BMI 35.66 kg/m2  EXAM: General appearance: alert and no distress Neck: no adenopathy, no carotid bruit, no JVD, supple, symmetrical, trachea midline and thyroid not enlarged, symmetric, no tenderness/mass/nodules Lungs: clear to auscultation bilaterally Heart: regular rate and rhythm, S1, S2 normal, no murmur, click, rub or gallop Abdomen: soft, non-tender; bowel sounds normal; no masses,  no organomegaly and obese Extremities: extremities normal, atraumatic, no  cyanosis or edema and tenderness to palpation over the right peroneal nerve distribution Pulses: 2+ and symmetric Skin: Skin color, texture, turgor normal. No rashes or lesions Neurologic: Motor: grossly normal CN II, XII intact  EKG: Sinus bradycardia at 52  ASSESSMENT: 1. Low to intermediate risk for an intermediate risk procedure 2. Hypertension-generally controlled 3. Dyslipidemia 4. Diabetes type 2 5. Obesity  PLAN: 1.   Mrs. Fontenot does have risk factors for coronary artery disease, but does not describe any symptoms that are limiting with a reasonable amount of exertion. Therefore there is little indication for preoperative stress testing. There are no baseline EKG abnormalities suggesting ischemia or prior infarct. She is well managed on her current medications. I think based on her symptoms that she should strongly consider surgery if it would provide her with an improved lifestyle and ability to exercise more which ultimately will help her weight, diabetes and her abnormal cholesterol.  She is currently on low-dose atenolol and I would continue that around the time of surgery as she appears adequately beta block with a heart rate in the 50s, which continues to be proven to be the best treatment to reduce perioperative risk.  Thank you again for this kind referral. She can followup with me as needed.  Chrystie Nose, MD, [facility] Attending Cardiologist [facility]  Hortensia Duffin C 03/01/2013, 9:40 AM

## 2013-03-01 NOTE — Patient Instructions (Signed)
Low to intermediate risk for surgery. No further testing indicated. Follow-up with me as needed.

## 2013-03-02 ENCOUNTER — Encounter: Payer: Self-pay | Admitting: Internal Medicine

## 2013-03-17 DIAGNOSIS — Z23 Encounter for immunization: Secondary | ICD-10-CM | POA: Diagnosis not present

## 2013-04-11 ENCOUNTER — Encounter (HOSPITAL_COMMUNITY): Payer: Self-pay | Admitting: Pharmacy Technician

## 2013-04-14 ENCOUNTER — Ambulatory Visit (HOSPITAL_COMMUNITY)
Admission: RE | Admit: 2013-04-14 | Discharge: 2013-04-14 | Disposition: A | Discharge status: Home / Self Care | Payer: Medicare Other | Source: Ambulatory Visit | Attending: Surgical | Admitting: Surgical

## 2013-04-14 ENCOUNTER — Other Ambulatory Visit (HOSPITAL_COMMUNITY): Payer: Self-pay | Admitting: Orthopedic Surgery

## 2013-04-14 ENCOUNTER — Encounter (HOSPITAL_COMMUNITY): Payer: Self-pay

## 2013-04-14 ENCOUNTER — Encounter (HOSPITAL_COMMUNITY)
Admission: RE | Admit: 2013-04-14 | Discharge: 2013-04-14 | Disposition: A | Discharge status: Home / Self Care | Payer: Medicare Other | Source: Ambulatory Visit | Attending: Orthopedic Surgery | Admitting: Orthopedic Surgery

## 2013-04-14 DIAGNOSIS — M129 Arthropathy, unspecified: Secondary | ICD-10-CM | POA: Insufficient documentation

## 2013-04-14 DIAGNOSIS — Z01812 Encounter for preprocedural laboratory examination: Secondary | ICD-10-CM | POA: Diagnosis not present

## 2013-04-14 DIAGNOSIS — Z01818 Encounter for other preprocedural examination: Secondary | ICD-10-CM | POA: Diagnosis not present

## 2013-04-14 DIAGNOSIS — M47817 Spondylosis without myelopathy or radiculopathy, lumbosacral region: Secondary | ICD-10-CM | POA: Diagnosis not present

## 2013-04-14 DIAGNOSIS — Z01811 Encounter for preprocedural respiratory examination: Secondary | ICD-10-CM | POA: Diagnosis not present

## 2013-04-14 LAB — SURGICAL PCR SCREEN
MRSA, PCR: NEGATIVE
Staphylococcus aureus: POSITIVE — AB

## 2013-04-14 LAB — URINALYSIS, ROUTINE W REFLEX MICROSCOPIC
Bilirubin Urine: NEGATIVE
Glucose, UA: NEGATIVE mg/dL
Hgb urine dipstick: NEGATIVE
Ketones, ur: NEGATIVE mg/dL
Leukocytes, UA: NEGATIVE
Nitrite: NEGATIVE
Protein, ur: NEGATIVE mg/dL
Specific Gravity, Urine: 1.015 (ref 1.005–1.030)
Urobilinogen, UA: 0.2 mg/dL (ref 0.0–1.0)
pH: 5.5 (ref 5.0–8.0)

## 2013-04-14 LAB — COMPREHENSIVE METABOLIC PANEL
ALT: 27 U/L (ref 0–35)
AST: 16 U/L (ref 0–37)
Albumin: 4.1 g/dL (ref 3.5–5.2)
Alkaline Phosphatase: 60 U/L (ref 39–117)
BUN: 11 mg/dL (ref 6–23)
CO2: 29 mEq/L (ref 19–32)
Calcium: 11.2 mg/dL — ABNORMAL HIGH (ref 8.4–10.5)
Chloride: 98 mEq/L (ref 96–112)
Creatinine, Ser: 0.63 mg/dL (ref 0.50–1.10)
GFR calc Af Amer: >90 mL/min (ref >90)
GFR calc non Af Amer: >90 mL/min (ref >90)
Glucose, Bld: 186 mg/dL — ABNORMAL HIGH (ref 70–99)
Potassium: 4.5 mEq/L (ref 3.5–5.1)
Sodium: 134 mEq/L — ABNORMAL LOW (ref 135–145)
Total Bilirubin: 0.5 mg/dL (ref 0.3–1.2)
Total Protein: 8.2 g/dL (ref 6.0–8.3)

## 2013-04-14 LAB — CBC
Hemoglobin: 14.8 g/dL (ref 12.0–15.0)
MCH: 32.2 pg (ref 26.0–34.0)
MCHC: 33.4 g/dL (ref 30.0–36.0)
MCV: 96.3 fL (ref 78.0–100.0)
Platelets: 372 10*3/uL (ref 150–400)
RDW: 12.9 % (ref 11.5–15.5)

## 2013-04-14 LAB — APTT: aPTT: 30 seconds (ref 24–37)

## 2013-04-14 LAB — PROTIME-INR
INR: 0.94 (ref 0.00–1.49)
Prothrombin Time: 12.4 s (ref 11.6–15.2)

## 2013-04-14 NOTE — Progress Notes (Signed)
04/14/13 0912  OBSTRUCTIVE SLEEP APNEA  Have you ever been diagnosed with sleep apnea through a sleep study? No  Do you snore loudly (loud enough to be heard through closed doors)?  1  Do you often feel tired, fatigued, or sleepy during the daytime? 1  Has anyone observed you stop breathing during your sleep? 0  Do you have, or are you being treated for high blood pressure? 1  BMI more than 35 kg/m2? 0  Age over 65 years old? 1  Neck circumference greater than 40 cm/18 inches? 0  Gender: 0  Obstructive Sleep Apnea Score 4  Score 4 or greater  Results sent to PCP

## 2013-04-14 NOTE — Patient Instructions (Addendum)
20 [name] [name]  04/14/2013   Your procedure is scheduled on:  04/21/13   FRIDAY  Report to [facility] at  1000     AM.  Call this number if you have problems the morning of surgery: [phone]       Remember:   Do not eat food  Or drink :After Midnight. Thursday NIGHT   Take these medicines the morning of surgery with A SIP OF WATER: ATENOLOL, LEVOTHYROXINE, OMEPRAZOLE    MAY TAKE NORCO or TRAMADOL IF NEEDED DO NOT TAKE ANY DIABETES MEDICATION MORNING OF SURGERY  .  Contacts, dentures or partial plates can not be worn to surgery  Leave suitcase in the car. After surgery it may be brought to your room.  For patients admitted to the hospital, checkout time is 11:00 AM day of  discharge.             SPECIAL INSTRUCTIONS- SEE  PREPARING FOR SURGERY INSTRUCTION SHEET-     DO NOT WEAR JEWELRY, LOTIONS, POWDERS, OR PERFUMES.  WOMEN-- DO NOT SHAVE LEGS OR UNDERARMS FOR 12 HOURS BEFORE SHOWERS. MEN MAY SHAVE FACE.  Patients discharged the day of surgery will not be allowed to drive home. IF going home the day of surgery, you must have a driver and someone to stay with you for the first 24 hours  Name and phone number of your driver:    ADMIT                                                                    Please read over the following fact sheets that you were given: MRSA Information, Incentive Spirometry Sheet, Blood Transfusion Sheet  Information                                                                                   [name] [name]  PST 336  [id number]                 FAILURE TO FOLLOW THESE INSTRUCTIONS MAY RESULT IN  CANCELLATION   OF YOUR SURGERY                                                  Patient Signature _____________________________

## 2013-04-14 NOTE — Progress Notes (Signed)
Clearance Dr Darcus Austin chart,  LOV with clearance, and ekg 9/14 dr Rennis Golden   EPIC

## 2013-04-18 NOTE — H&P (Signed)
[name] [name] is an 67 y.o. female.   Chief Complaint: back pain HPI: [name] [name] presented with the chief complaint of back pain. She has had pain in the low back that radiates into the left LE for over as year. She denies injury. She has had multiple injections which have provided minimal relief.The bottom line is that she has a central canal stenosis at L4-5. She has a disk herniation on the left at L4-5 that minimally displaces the L4 and also a ganglion. She has left lateral recess stenosis.   Past Medical History  Diagnosis Date  . Hypertension   . Hypothyroidism   . GERD (gastroesophageal reflux disease)   . DM type 2 (diabetes mellitus, type 2)   . DJD (degenerative joint disease)   . DDD (degenerative disc disease)   . Pancreatitis     h/o, twice    Past Surgical History  Procedure Laterality Date  . Rotator cuff repair  1995    right  . Total abdominal hysterectomy  1984  . Breast cyst excision      left, benign     Family History  Problem Relation Age of Onset  . Dementia Mother     age 32 in 2014, heart disease  . Heart disease Father     CAD, CABG  . Heart disease Brother     HTN, CBAG  . Hypertension Brother   . Fibromyalgia Sister    Social History:  reports that she has never smoked. She has never used smokeless tobacco. She reports that she does not drink alcohol or use illicit drugs.  Allergies:  Allergies  Allergen Reactions  . Hydrocodone Itching  . Penicillins Itching     Current outpatient prescriptions: acetaminophen (TYLENOL) 325 MG tablet, Take 650 mg by mouth every 6 (six) hours as needed for mild pain, moderate pain or headache., Disp: , Rfl: ;   aspirin 81 MG tablet, Take 81 mg by mouth daily., Disp: , Rfl: ;  atenolol (TENORMIN) 50 MG tablet, Take 50 mg by mouth daily., Disp: , Rfl: ;   benazepril (LOTENSIN) 20 MG tablet, Take 20 mg by mouth daily., Disp: , Rfl:  estradiol (ESTRACE) 0.5 MG tablet, Take 1 tablet by mouth every other day. ,  Disp: , Rfl: ;   gemfibrozil (LOPID) 600 MG tablet, Take 600 mg by mouth 2 (two) times daily before a meal. , Disp: , Rfl: ;   HYDROcodone-acetaminophen (NORCO) 10-325 MG per tablet, Take 1 tablet by mouth every 4 (four) hours as needed for moderate pain or severe pain. , Disp: , Rfl:  levothyroxine (SYNTHROID, LEVOTHROID) 88 MCG tablet, Take 88 mcg by mouth daily before breakfast., Disp: , Rfl: ;   metFORMIN (GLUCOPHAGE-XR) 500 MG 24 hr tablet, Take 1 tablet by mouth 2 (two) times daily., Disp: , Rfl: ;   Multiple Vitamin (MULTIVITAMIN) capsule, Take 1 capsule by mouth every other day. , Disp: , Rfl: ;   omeprazole (PRILOSEC) 20 MG capsule, Take 20 mg by mouth daily., Disp: , Rfl:  traMADol (ULTRAM) 50 MG tablet, Take by mouth every 6 (six) hours as needed for moderate pain or severe pain., Disp: , Rfl:    Review of Systems  Constitutional: Negative.   HENT: Negative.   Eyes: Negative.   Respiratory: Negative.   Cardiovascular: Negative.   Gastrointestinal: Negative.   Genitourinary: Negative.   Musculoskeletal: Positive for back pain. Negative for falls, joint pain, myalgias and neck pain.  Skin: Negative.   Neurological:  Positive for tingling and sensory change. Negative for dizziness, tremors, speech change, focal weakness, seizures and loss of consciousness.  Endo/Heme/Allergies: Negative.   Psychiatric/Behavioral: Negative.     Physical Exam  Constitutional: She is oriented to person, place, and time. She appears well-developed and well-nourished. No distress.  HENT:  Head: Normocephalic and atraumatic.  Right Ear: External ear normal.  Left Ear: External ear normal.  Nose: Nose normal.  Mouth/Throat: Oropharynx is clear and moist.  Eyes: Conjunctivae and EOM are normal.  Neck: Normal range of motion. Neck supple.  Cardiovascular: Normal rate, regular rhythm, normal heart sounds and intact distal pulses.   No murmur heard. Respiratory: Effort normal and breath sounds  normal. No respiratory distress. She has no wheezes.  GI: Soft. Bowel sounds are normal. She exhibits no distension. There is no tenderness.  Musculoskeletal:       Right hip: Normal.       Left hip: Normal.       Right knee: Normal.       Left knee: Normal.       Lumbar back: She exhibits decreased range of motion, tenderness, pain and spasm.       Right lower leg: She exhibits no tenderness and no swelling.       Left lower leg: She exhibits no tenderness and no swelling.  On examination of her back she has a painful motion of her back. Flexion to the left this reproduces her left sciatica. She has a slightly positive straight leg raise on the left. There is a minimal weakness of her toe extensors on the left as compared to the right. She has a good dorsalis pedis pulse.Sensory exam is decreased over the L5 nerve root distribution. She has a painless range of motion of her knees an a painless range of motion of her hips. There is no peripheral edema noted.  Neurological: She is alert and oriented to person, place, and time. She has normal reflexes. A sensory deficit is present.  Skin: No rash noted. She is not diaphoretic. No erythema.  Psychiatric: She has a normal mood and affect. Her behavior is normal.     Assessment/Plan Lumbar spinal stenosis L4-L5, with disc herniation to the left She needs a lumbar decompression with microdiscectomy, left L4-L5. The possible complications of spinal surgery number one could be infection, which is extremely rare. We do use antibiotics prior to the surgery and during surgery and after surgery. Number two is always a slight degree of probability that you could develop a blood clot in your leg after any type of surgery and we try our best to prevent that with aspirin post op when it is safe to begin. The third is a dural leak. That is the spinal fluid leak that could occur. At certain rare times the bone or the disc could literally stick to the dura which is  the lining which contains the spinal fluid and we could develop a small tear in that lining which we then patch up. That is an extremely rare complication. The last and final complication is a recurrent disc rupture. That means that you could rupture another small piece of disc later on down the road and there is about a 2% chance of that.   Wafaa Deemer LAUREN 04/18/2013, 11:55 AM

## 2013-04-21 ENCOUNTER — Inpatient Hospital Stay (HOSPITAL_COMMUNITY): Payer: Medicare Other

## 2013-04-21 ENCOUNTER — Encounter (HOSPITAL_COMMUNITY): Payer: Medicare Other | Admitting: Anesthesiology

## 2013-04-21 ENCOUNTER — Encounter (HOSPITAL_COMMUNITY)
Admission: RE | Disposition: A | Discharge status: Home / Self Care | Payer: Self-pay | Source: Ambulatory Visit | Attending: Orthopedic Surgery

## 2013-04-21 ENCOUNTER — Inpatient Hospital Stay (HOSPITAL_COMMUNITY): Payer: Medicare Other | Admitting: Anesthesiology

## 2013-04-21 ENCOUNTER — Observation Stay (HOSPITAL_COMMUNITY)
Admission: RE | Admit: 2013-04-21 | Discharge: 2013-04-22 | Disposition: A | Discharge status: Home / Self Care | Payer: Medicare Other | Source: Ambulatory Visit | Attending: Orthopedic Surgery | Admitting: Orthopedic Surgery

## 2013-04-21 ENCOUNTER — Encounter (HOSPITAL_COMMUNITY): Payer: Self-pay | Admitting: *Deleted

## 2013-04-21 DIAGNOSIS — Z885 Allergy status to narcotic agent status: Secondary | ICD-10-CM | POA: Insufficient documentation

## 2013-04-21 DIAGNOSIS — E039 Hypothyroidism, unspecified: Secondary | ICD-10-CM | POA: Diagnosis not present

## 2013-04-21 DIAGNOSIS — M5126 Other intervertebral disc displacement, lumbar region: Secondary | ICD-10-CM | POA: Diagnosis not present

## 2013-04-21 DIAGNOSIS — Z90710 Acquired absence of both cervix and uterus: Secondary | ICD-10-CM | POA: Diagnosis not present

## 2013-04-21 DIAGNOSIS — I10 Essential (primary) hypertension: Secondary | ICD-10-CM | POA: Diagnosis not present

## 2013-04-21 DIAGNOSIS — K219 Gastro-esophageal reflux disease without esophagitis: Secondary | ICD-10-CM | POA: Insufficient documentation

## 2013-04-21 DIAGNOSIS — E119 Type 2 diabetes mellitus without complications: Secondary | ICD-10-CM | POA: Insufficient documentation

## 2013-04-21 DIAGNOSIS — Z7982 Long term (current) use of aspirin: Secondary | ICD-10-CM | POA: Insufficient documentation

## 2013-04-21 DIAGNOSIS — Z79899 Other long term (current) drug therapy: Secondary | ICD-10-CM | POA: Diagnosis not present

## 2013-04-21 DIAGNOSIS — M48061 Spinal stenosis, lumbar region without neurogenic claudication: Secondary | ICD-10-CM | POA: Diagnosis not present

## 2013-04-21 DIAGNOSIS — M519 Unspecified thoracic, thoracolumbar and lumbosacral intervertebral disc disorder: Secondary | ICD-10-CM | POA: Diagnosis not present

## 2013-04-21 DIAGNOSIS — M48062 Spinal stenosis, lumbar region with neurogenic claudication: Secondary | ICD-10-CM | POA: Diagnosis present

## 2013-04-21 DIAGNOSIS — R29898 Other symptoms and signs involving the musculoskeletal system: Secondary | ICD-10-CM | POA: Insufficient documentation

## 2013-04-21 DIAGNOSIS — Z880 Allergy status to penicillin: Secondary | ICD-10-CM | POA: Diagnosis not present

## 2013-04-21 HISTORY — PX: LUMBAR LAMINECTOMY: SHX95

## 2013-04-21 LAB — GLUCOSE, CAPILLARY: Glucose-Capillary: 140 mg/dL — ABNORMAL HIGH (ref 70–99)

## 2013-04-21 SURGERY — MICRODISCECTOMY LUMBAR LAMINECTOMY
Anesthesia: General | Site: Back | Wound class: Clean

## 2013-04-21 MED ORDER — PHENOL 1.4 % MT LIQD: 1.0000 | OROMUCOSAL | Status: DC | PRN

## 2013-04-21 MED ORDER — PROPOFOL 10 MG/ML IV BOLUS
INTRAVENOUS | Status: DC | PRN
  Administered 2013-04-21: 150 mg via INTRAVENOUS
  Administered 2013-04-21: 30 mg via INTRAVENOUS
  Administered 2013-04-21: 20 mg via INTRAVENOUS

## 2013-04-21 MED ORDER — LEVOTHYROXINE SODIUM 88 MCG PO TABS
88.0000 ug | ORAL_TABLET | Freq: Every day | ORAL | Status: DC
  Administered 2013-04-22: 08:00:00 88 ug via ORAL
  Filled 2013-04-21 (×2): qty 1

## 2013-04-21 MED ORDER — BUPIVACAINE LIPOSOME 1.3 % IJ SUSP
20.0000 mL | Freq: Once | INTRAMUSCULAR | Status: AC
  Administered 2013-04-21: 20 mL
  Filled 2013-04-21: qty 20

## 2013-04-21 MED ORDER — HYDROMORPHONE HCL PF 1 MG/ML IJ SOLN
INTRAMUSCULAR | Status: AC
  Filled 2013-04-21: qty 1

## 2013-04-21 MED ORDER — LACTATED RINGERS IV SOLN
INTRAVENOUS | Status: DC | PRN
  Administered 2013-04-21 (×2): via INTRAVENOUS

## 2013-04-21 MED ORDER — HYDROMORPHONE HCL PF 1 MG/ML IJ SOLN
0.2500 mg | INTRAMUSCULAR | Status: DC | PRN
  Administered 2013-04-21 (×3): 0.5 mg via INTRAVENOUS

## 2013-04-21 MED ORDER — GLYCOPYRROLATE 0.2 MG/ML IJ SOLN
INTRAMUSCULAR | Status: DC | PRN
  Administered 2013-04-21: 0.3 mg via INTRAVENOUS

## 2013-04-21 MED ORDER — BUPIVACAINE-EPINEPHRINE PF 0.25-1:200000 % IJ SOLN
INTRAMUSCULAR | Status: AC
  Filled 2013-04-21: qty 30

## 2013-04-21 MED ORDER — MIDAZOLAM HCL 5 MG/5ML IJ SOLN
INTRAMUSCULAR | Status: DC | PRN
  Administered 2013-04-21: 2 mg via INTRAVENOUS

## 2013-04-21 MED ORDER — FLEET ENEMA 7-19 GM/118ML RE ENEM: 1.0000 | ENEMA | Freq: Once | RECTAL | Status: AC | PRN

## 2013-04-21 MED ORDER — METFORMIN HCL ER 500 MG PO TB24
500.0000 mg | ORAL_TABLET | Freq: Two times a day (BID) | ORAL | Status: DC
  Administered 2013-04-21 – 2013-04-22 (×2): 500 mg via ORAL
  Filled 2013-04-21 (×4): qty 1

## 2013-04-21 MED ORDER — EPHEDRINE SULFATE 50 MG/ML IJ SOLN
INTRAMUSCULAR | Status: DC | PRN
  Administered 2013-04-21 (×3): 5 mg via INTRAVENOUS

## 2013-04-21 MED ORDER — ACETAMINOPHEN 650 MG RE SUPP: 650.0000 mg | RECTAL | Status: DC | PRN

## 2013-04-21 MED ORDER — NEOSTIGMINE METHYLSULFATE 1 MG/ML IJ SOLN
INTRAMUSCULAR | Status: DC | PRN
  Administered 2013-04-21: 2.5 mg via INTRAVENOUS

## 2013-04-21 MED ORDER — ROCURONIUM BROMIDE 100 MG/10ML IV SOLN
INTRAVENOUS | Status: DC | PRN
  Administered 2013-04-21: 40 mg via INTRAVENOUS

## 2013-04-21 MED ORDER — PANTOPRAZOLE SODIUM 40 MG PO TBEC
40.0000 mg | DELAYED_RELEASE_TABLET | Freq: Every day | ORAL | Status: DC
  Administered 2013-04-22: 10:00:00 40 mg via ORAL
  Filled 2013-04-21: qty 1

## 2013-04-21 MED ORDER — BENAZEPRIL HCL 20 MG PO TABS
20.0000 mg | ORAL_TABLET | Freq: Every day | ORAL | Status: DC
  Administered 2013-04-21: 20 mg via ORAL
  Filled 2013-04-21 (×2): qty 1

## 2013-04-21 MED ORDER — BISACODYL 10 MG RE SUPP: 10.0000 mg | Freq: Every day | RECTAL | Status: DC | PRN

## 2013-04-21 MED ORDER — LACTATED RINGERS IV SOLN: INTRAVENOUS | Status: DC

## 2013-04-21 MED ORDER — THROMBIN 5000 UNITS EX SOLR
CUTANEOUS | Status: DC | PRN
  Administered 2013-04-21: 10000 [IU] via TOPICAL

## 2013-04-21 MED ORDER — ACETAMINOPHEN 325 MG PO TABS: 650.0000 mg | ORAL_TABLET | ORAL | Status: DC | PRN

## 2013-04-21 MED ORDER — METHOCARBAMOL 100 MG/ML IJ SOLN
500.0000 mg | Freq: Four times a day (QID) | INTRAMUSCULAR | Status: DC | PRN
  Filled 2013-04-21: qty 5

## 2013-04-21 MED ORDER — PROMETHAZINE HCL 25 MG/ML IJ SOLN: 6.2500 mg | INTRAMUSCULAR | Status: DC | PRN

## 2013-04-21 MED ORDER — ONDANSETRON HCL 4 MG/2ML IJ SOLN: 4.0000 mg | INTRAMUSCULAR | Status: DC | PRN

## 2013-04-21 MED ORDER — GEMFIBROZIL 600 MG PO TABS
600.0000 mg | ORAL_TABLET | Freq: Two times a day (BID) | ORAL | Status: DC
  Administered 2013-04-21 – 2013-04-22 (×2): 600 mg via ORAL
  Filled 2013-04-21 (×4): qty 1

## 2013-04-21 MED ORDER — HYDROMORPHONE HCL PF 1 MG/ML IJ SOLN
0.5000 mg | INTRAMUSCULAR | Status: DC | PRN
  Administered 2013-04-21 (×2): 1 mg via INTRAVENOUS
  Filled 2013-04-21 (×2): qty 1

## 2013-04-21 MED ORDER — ONDANSETRON HCL 4 MG/2ML IJ SOLN
INTRAMUSCULAR | Status: DC | PRN
  Administered 2013-04-21: 4 mg via INTRAVENOUS

## 2013-04-21 MED ORDER — BUPIVACAINE-EPINEPHRINE PF 0.25-1:200000 % IJ SOLN
INTRAMUSCULAR | Status: DC | PRN
  Administered 2013-04-21: 20 mL

## 2013-04-21 MED ORDER — LIDOCAINE HCL (CARDIAC) 20 MG/ML IV SOLN
INTRAVENOUS | Status: DC | PRN
  Administered 2013-04-21: 50 mg via INTRAVENOUS

## 2013-04-21 MED ORDER — ESTRADIOL 1 MG PO TABS
0.5000 mg | ORAL_TABLET | ORAL | Status: DC
  Administered 2013-04-22: 0.5 mg via ORAL
  Filled 2013-04-21: qty 0.5

## 2013-04-21 MED ORDER — METHOCARBAMOL 500 MG PO TABS
500.0000 mg | ORAL_TABLET | Freq: Four times a day (QID) | ORAL | Status: DC | PRN
  Administered 2013-04-21 – 2013-04-22 (×2): 500 mg via ORAL
  Filled 2013-04-21 (×2): qty 1

## 2013-04-21 MED ORDER — BACITRACIN-NEOMYCIN-POLYMYXIN 400-5-5000 EX OINT
TOPICAL_OINTMENT | CUTANEOUS | Status: AC
  Filled 2013-04-21: qty 1

## 2013-04-21 MED ORDER — CLINDAMYCIN PHOSPHATE 600 MG/50ML IV SOLN
600.0000 mg | Freq: Three times a day (TID) | INTRAVENOUS | Status: AC
  Administered 2013-04-21 – 2013-04-22 (×2): 600 mg via INTRAVENOUS
  Filled 2013-04-21 (×2): qty 50

## 2013-04-21 MED ORDER — THROMBIN 5000 UNITS EX SOLR
CUTANEOUS | Status: AC
  Filled 2013-04-21: qty 10000

## 2013-04-21 MED ORDER — LACTATED RINGERS IV SOLN
INTRAVENOUS | Status: DC
  Administered 2013-04-21: 17:00:00 via INTRAVENOUS

## 2013-04-21 MED ORDER — CLINDAMYCIN PHOSPHATE 900 MG/50ML IV SOLN
INTRAVENOUS | Status: AC
  Filled 2013-04-21: qty 50

## 2013-04-21 MED ORDER — MENTHOL 3 MG MT LOZG: 1.0000 | LOZENGE | OROMUCOSAL | Status: DC | PRN

## 2013-04-21 MED ORDER — ATENOLOL 50 MG PO TABS
50.0000 mg | ORAL_TABLET | Freq: Every day | ORAL | Status: DC
  Filled 2013-04-21 (×2): qty 1

## 2013-04-21 MED ORDER — HYDROMORPHONE HCL 2 MG PO TABS
2.0000 mg | ORAL_TABLET | ORAL | Status: DC | PRN
  Administered 2013-04-21 – 2013-04-22 (×4): 2 mg via ORAL
  Filled 2013-04-21 (×5): qty 1

## 2013-04-21 MED ORDER — SODIUM CHLORIDE 0.9 % IR SOLN
Status: DC | PRN
  Administered 2013-04-21: 13:00:00

## 2013-04-21 MED ORDER — CLINDAMYCIN PHOSPHATE 900 MG/50ML IV SOLN
900.0000 mg | INTRAVENOUS | Status: AC
  Administered 2013-04-21: 900 mg via INTRAVENOUS

## 2013-04-21 MED ORDER — FENTANYL CITRATE 0.05 MG/ML IJ SOLN
INTRAMUSCULAR | Status: DC | PRN
  Administered 2013-04-21: 100 ug via INTRAVENOUS
  Administered 2013-04-21: 25 ug via INTRAVENOUS

## 2013-04-21 MED ORDER — INSULIN ASPART 100 UNIT/ML ~~LOC~~ SOLN
0.0000 [IU] | Freq: Three times a day (TID) | SUBCUTANEOUS | Status: DC
  Administered 2013-04-21: 2 [IU] via SUBCUTANEOUS

## 2013-04-21 MED ORDER — POLYETHYLENE GLYCOL 3350 17 G PO PACK: 17.0000 g | PACK | Freq: Every day | ORAL | Status: DC | PRN

## 2013-04-21 SURGICAL SUPPLY — 41 items
BAG ZIPLOCK 12X15 (MISCELLANEOUS) ×2 IMPLANT
BENZOIN TINCTURE PRP APPL 2/3 (GAUZE/BANDAGES/DRESSINGS) ×2 IMPLANT
CLEANER TIP ELECTROSURG 2X2 (MISCELLANEOUS) ×2 IMPLANT
CONT SPECI 4OZ STER CLIK (MISCELLANEOUS) ×2 IMPLANT
DRAIN PENROSE 18X1/4 LTX STRL (WOUND CARE) IMPLANT
DRAPE LG THREE QUARTER DISP (DRAPES) ×4 IMPLANT
DRAPE MICROSCOPE LEICA (MISCELLANEOUS) ×2 IMPLANT
DRAPE POUCH INSTRU U-SHP 10X18 (DRAPES) ×2 IMPLANT
DRAPE SURG 17X11 SM STRL (DRAPES) ×2 IMPLANT
DRSG ADAPTIC 3X8 NADH LF (GAUZE/BANDAGES/DRESSINGS) ×2 IMPLANT
DRSG PAD ABDOMINAL 8X10 ST (GAUZE/BANDAGES/DRESSINGS) ×6 IMPLANT
DURAPREP 26ML APPLICATOR (WOUND CARE) ×2 IMPLANT
ELECT REM PT RETURN 9FT ADLT (ELECTROSURGICAL) ×2
ELECTRODE REM PT RTRN 9FT ADLT (ELECTROSURGICAL) ×1 IMPLANT
GLOVE BIOGEL PI IND STRL 8 (GLOVE) ×1 IMPLANT
GLOVE BIOGEL PI INDICATOR 8 (GLOVE) ×1
GLOVE ECLIPSE 8.0 STRL XLNG CF (GLOVE) ×8 IMPLANT
GOWN STRL REIN XL XLG (GOWN DISPOSABLE) ×4 IMPLANT
KIT BASIN OR (CUSTOM PROCEDURE TRAY) ×2 IMPLANT
KIT POSITIONING SURG ANDREWS (MISCELLANEOUS) ×2 IMPLANT
MANIFOLD NEPTUNE II (INSTRUMENTS) ×2 IMPLANT
NEEDLE SPNL 18GX3.5 QUINCKE PK (NEEDLE) ×6 IMPLANT
NS IRRIG 1000ML POUR BTL (IV SOLUTION) ×2 IMPLANT
PATTIES SURGICAL .5 X.5 (GAUZE/BANDAGES/DRESSINGS) IMPLANT
PATTIES SURGICAL .75X.75 (GAUZE/BANDAGES/DRESSINGS) IMPLANT
PATTIES SURGICAL 1X1 (DISPOSABLE) IMPLANT
PIN SAFETY NICK PLATE  2 MED (MISCELLANEOUS)
PIN SAFETY NICK PLATE 2 MED (MISCELLANEOUS) IMPLANT
POSITIONER SURGICAL ARM (MISCELLANEOUS) ×2 IMPLANT
SPONGE GAUZE 4X4 12PLY (GAUZE/BANDAGES/DRESSINGS) ×2 IMPLANT
SPONGE LAP 4X18 X RAY DECT (DISPOSABLE) IMPLANT
SPONGE SURGIFOAM ABS GEL 100 (HEMOSTASIS) ×2 IMPLANT
STAPLER VISISTAT 35W (STAPLE) IMPLANT
SUT VIC AB 0 CT1 27 (SUTURE) ×1
SUT VIC AB 0 CT1 27XBRD ANTBC (SUTURE) ×1 IMPLANT
SUT VIC AB 1 CT1 27 (SUTURE) ×3
SUT VIC AB 1 CT1 27XBRD ANTBC (SUTURE) ×3 IMPLANT
TAPE CLOTH SURG 4X10 WHT LF (GAUZE/BANDAGES/DRESSINGS) ×2 IMPLANT
TOWEL OR 17X26 10 PK STRL BLUE (TOWEL DISPOSABLE) ×4 IMPLANT
TRAY LAMINECTOMY (CUSTOM PROCEDURE TRAY) ×2 IMPLANT
WATER STERILE IRR 1500ML POUR (IV SOLUTION) ×2 IMPLANT

## 2013-04-21 NOTE — Transfer of Care (Signed)
Immediate Anesthesia Transfer of Care Note  Patient: [name] [name]  Procedure(s) Performed: Procedure(s): CENTRAL MICRODISCECTOMY AND COMPLETE DECOMPRESSION OF LUMBAR LAMINECTOMY L4-L5  (N/A)  Patient Location: PACU  Anesthesia Type:General  Level of Consciousness: awake, alert  and oriented  Airway & Oxygen Therapy: Patient Spontanous Breathing and Patient connected to face mask oxygen  Post-op Assessment: Report given to PACU RN and Post -op Vital signs reviewed and stable  Post vital signs: Reviewed and stable  Complications: No apparent anesthesia complications

## 2013-04-21 NOTE — Anesthesia Preprocedure Evaluation (Addendum)
Anesthesia Evaluation  Patient identified by MRN, date of birth, ID band Patient awake    Reviewed: Allergy & Precautions, H&P , NPO status , Patient's Chart, lab work & pertinent test results  Airway Mallampati: II TM Distance: >3 FB Neck ROM: Full    Dental no notable dental hx.    Pulmonary neg pulmonary ROS,  breath sounds clear to auscultation  Pulmonary exam normal       Cardiovascular hypertension, Pt. on medications and Pt. on home beta blockers negative cardio ROS  Rhythm:Regular Rate:Normal     Neuro/Psych  Neuromuscular disease negative psych ROS   GI/Hepatic negative GI ROS, Neg liver ROS, GERD-  Medicated,  Endo/Other  diabetes, Type 2, Oral Hypoglycemic AgentsHypothyroidism   Renal/GU negative Renal ROS  negative genitourinary   Musculoskeletal negative musculoskeletal ROS (+)   Abdominal (+) + obese,   Peds negative pediatric ROS (+)  Hematology negative hematology ROS (+)   Anesthesia Other Findings   Reproductive/Obstetrics negative OB ROS                          Anesthesia Physical Anesthesia Plan  ASA: III  Anesthesia Plan: General   Post-op Pain Management:    Induction: Intravenous  Airway Management Planned: Oral ETT  Additional Equipment:   Intra-op Plan:   Post-operative Plan: Extubation in OR  Informed Consent: I have reviewed the patients History and Physical, chart, labs and discussed the procedure including the risks, benefits and alternatives for the proposed anesthesia with the patient or authorized representative who has indicated his/her understanding and acceptance.   Dental advisory given  Plan Discussed with: CRNA  Anesthesia Plan Comments:         Anesthesia Quick Evaluation

## 2013-04-21 NOTE — Brief Op Note (Signed)
04/21/2013  2:57 PM  PATIENT:  [name] [name]  67 y.o. female  PRE-OPERATIVE DIAGNOSIS:  SPINAL STENOSIS and Herniated Lumbar Disc at L-4-L-5  POST-OPERATIVE DIAGNOSIS:  lumbar spinal stenosis with herniated nucleus pulposus at L-4-L-5 on the Left.  PROCEDURE:  Procedure(s): CENTRAL MICRODISCECTOMY AND COMPLETE DECOMPRESSION OF LUMBAR LAMINECTOMY L4-L5  (N/A),and Microdiscectomy on the Left.  SURGEON:  Surgeon(s) and Role:    * Jacki Cones, MD - Primary    * Drucilla Schmidt, MD - Assisting    ASSISTANTS:James Aplington MD   ANESTHESIA:   general  EBL:  Total I/O In: 1250 [I.V.:1250] Out: 25 [Blood:25]  BLOOD ADMINISTERED:none  DRAINS: none   LOCAL MEDICATIONS USED:  MARCAINE 20cc of 0.50% with Epinephrine at the beginning of the case and Exparel 20cc at the end of the case.    SPECIMEN:  Source of Specimen:  L-4-L-5  DISPOSITION OF SPECIMEN:  PATHOLOGY  COUNTS:  YES  TOURNIQUET:  * No tourniquets in log *  DICTATION: .Other Dictation: Dictation Number [id number]  PLAN OF CARE: Admit for overnight observation  PATIENT DISPOSITION:  PACU - hemodynamically stable.   Delay start of Pharmacological VTE agent (>24hrs) due to surgical blood loss or risk of bleeding: yes

## 2013-04-21 NOTE — Anesthesia Postprocedure Evaluation (Signed)
  Anesthesia Post-op Note  Patient: [name] [name]  Procedure(s) Performed: Procedure(s) (LRB): CENTRAL MICRODISCECTOMY AND COMPLETE DECOMPRESSION OF LUMBAR LAMINECTOMY L4-L5  (N/A)  Patient Location: PACU  Anesthesia Type: General  Level of Consciousness: awake and alert   Airway and Oxygen Therapy: Patient Spontanous Breathing  Post-op Pain: mild  Post-op Assessment: Post-op Vital signs reviewed, Patient's Cardiovascular Status Stable, Respiratory Function Stable, Patent Airway and No signs of Nausea or vomiting  Last Vitals:  Filed Vitals:   04/21/13 1500  BP: 113/49  Pulse: 73  Temp:   Resp: 21    Post-op Vital Signs: stable   Complications: No apparent anesthesia complications

## 2013-04-21 NOTE — Preoperative (Signed)
Beta Blockers   Reason not to administer Beta Blockers:Not Applicable 

## 2013-04-21 NOTE — Interval H&P Note (Signed)
History and Physical Interval Note:  04/21/2013 12:47 PM  [name] [name]  has presented today for surgery, with the diagnosis of SPINAL STENOSIS   The various methods of treatment have been discussed with the patient and family. After consideration of risks, benefits and other options for treatment, the patient has consented to  Procedure(s): CENTRAL MICRODISCECTOMY AND COMPLETE DECOMPRESSION OF LUMBAR LAMINECTOMY L4-L5  (N/A) as a surgical intervention .  The patient's history has been reviewed, patient examined, no change in status, stable for surgery.  I have reviewed the patient's chart and labs.  Questions were answered to the patient's satisfaction.     Christyann Manolis A

## 2013-04-22 DIAGNOSIS — I10 Essential (primary) hypertension: Secondary | ICD-10-CM | POA: Diagnosis not present

## 2013-04-22 DIAGNOSIS — M48061 Spinal stenosis, lumbar region without neurogenic claudication: Secondary | ICD-10-CM | POA: Diagnosis not present

## 2013-04-22 DIAGNOSIS — M5126 Other intervertebral disc displacement, lumbar region: Secondary | ICD-10-CM | POA: Diagnosis not present

## 2013-04-22 DIAGNOSIS — R29898 Other symptoms and signs involving the musculoskeletal system: Secondary | ICD-10-CM | POA: Diagnosis not present

## 2013-04-22 DIAGNOSIS — E039 Hypothyroidism, unspecified: Secondary | ICD-10-CM | POA: Diagnosis not present

## 2013-04-22 DIAGNOSIS — K219 Gastro-esophageal reflux disease without esophagitis: Secondary | ICD-10-CM | POA: Diagnosis not present

## 2013-04-22 LAB — GLUCOSE, CAPILLARY: Glucose-Capillary: 172 mg/dL — ABNORMAL HIGH (ref 70–99)

## 2013-04-22 NOTE — Progress Notes (Signed)
OT Cancellation Note  Patient Details Name: [name] [name] MRN: [id number] DOB: 1945/11/29   Cancelled Treatment:    Reason Eval/Treat Not Completed: OT screened, no needs identified, will sign off (dtr reports she will assist pt with all ADLs, 3 in 1 ordered)  Carter Kaman A 04/22/2013, 11:29 AM

## 2013-04-22 NOTE — Progress Notes (Signed)
04/22/2013 1030 NCM contacted [facility] DME rep for 3n1 for home. No additional NCM needs identified.  Isidoro Donning RN CCM Case Mgmt phone [phone]

## 2013-04-22 NOTE — Progress Notes (Signed)
Subjective: 1 Day Post-Op Procedure(s) (LRB): CENTRAL MICRODISCECTOMY AND COMPLETE DECOMPRESSION OF LUMBAR LAMINECTOMY L4-L5  (N/A) Patient reports pain as Pt doing well this AM, pain well tolerated with oral medication, able to stand from bed, transfer to chair and bathroom with help of 1 assist with no walker..  Pt c/o acid reflux this AM but said it was tolerable.  Pt denies N/V/F/C, chest pain, SOB, calf pain, and changes in appetite.    Objective: Vital signs in last 24 hours: Temp:  [97.3 F (36.3 C)-99.6 F (37.6 C)] 99.6 F (37.6 C) (11/15 0550) Pulse Rate:  [63-83] 73 (11/15 0550) Resp:  [16-21] 16 (11/15 0550) BP: (84-162)/(49-80) 84/54 mmHg (11/15 0550) SpO2:  [96 %-100 %] 100 % (11/15 0550) Weight:  [85 kg (187 lb 6.3 oz)] 85 kg (187 lb 6.3 oz) (11/14 2050)  Intake/Output from previous day: 11/14 0701 - 11/15 0700 In: 3326.7 [P.O.:360; I.V.:2966.7] Out: 25 [Blood:25] Intake/Output this shift:    No results found for this basename: HGB,  in the last 72 hours No results found for this basename: WBC, RBC, HCT, PLT,  in the last 72 hours No results found for this basename: NA, K, CL, CO2, BUN, CREATININE, GLUCOSE, CALCIUM,  in the last 72 hours No results found for this basename: LABPT, INR,  in the last 72 hours  Dressing c/d/i, no saturation noted on dressing, negative Homan's sign.  Lower extremities are NVI with good motor control of plantar/dorsiflexion of great toes and feet bilaterally.  Normal sensation to light touch throughout bilateral lower extremities.  DP pulses are 2+ bilaterally, toes well perfused with cap refill <2sec.  Mild TTP to lower back.  Assessment/Plan: 1 Day Post-Op Procedure(s) (LRB): CENTRAL MICRODISCECTOMY AND COMPLETE DECOMPRESSION OF LUMBAR LAMINECTOMY L4-L5  (N/A) Up with therapy Discharge home, f/u with Dr. Darrelyn Hillock in 2 weeks May remove bandage in 3 days and shower   Jacorion Klem S 04/22/2013, 9:21 AM

## 2013-04-22 NOTE — Evaluation (Signed)
Physical Therapy Evaluation Patient Details Name: [name] [name] MRN: [id number] DOB: 1946-02-03 Today's Date: 04/22/2013 Time: 1030-1100 PT Time Calculation (min): 30 min  PT Assessment / Plan / Recommendation History of Present Illness  Pt with spinal stenosis in lumbar region now s/p decompression lumbar laminectomy at L4-5 and microdiscetomy at L3-4 left.   Clinical Impression  Pt presents with normal back pain, decreased LE radiculopathy, tolerating ambulating with AD but slow and guarded. Pt and dtr have been educated and feel confident with how to progress at home with DC today.    PT Assessment  Patent does not need any further PT services    Follow Up Recommendations  No PT follow up    Does the patient have the potential to tolerate intense rehabilitation      Barriers to Discharge        Equipment Recommendations  3in1 (PT);Other (comment) (CM called to process 3n1 for pt )    Recommendations for Other Services     Frequency      Precautions / Restrictions Precautions Precautions: Back Precaution Comments: educated and demostrated back precautions with sitting, mobility, car transfers, bed mobility and no bending arching twisting to dtr and pt.  Restrictions Weight Bearing Restrictions: No   Pertinent Vitals/Pain Pt had pain just at LB area at 3/10, most pain at moment was with discomfort from reflux, nursing aware.       Mobility  Bed Mobility Details for Bed Mobility Assistance: pt refused bed mobility at the time due to having such difficulties with reflux burning so bad needed to stay upright. Educated and demo with proper bed mechanics and positions  Transfers Transfers: Sit to Stand;Stand to Sit Sit to Stand: 4: Min guard;With armrests Stand to Sit: 4: Min guard;With armrests Details for Transfer Assistance: just guarded but steady Ambulation/Gait Ambulation/Gait Assistance: 4: Min guard Ambulation Distance (Feet): 20 Feet Assistive device:  None Ambulation/Gait Assistance Details: very slow guarded  Gait Pattern: Step-through pattern;Decreased step length - right;Decreased step length - left Gait velocity: very slow    Exercises     PT Diagnosis:    PT Problem List:   PT Treatment Interventions:       PT Goals(Current goals can be found in the care plan section) Acute Rehab PT Goals PT Goal Formulation: No goals set, d/c therapy Potential to Achieve Goals: Good  Visit Information  Last PT Received On: 04/22/13 Assistance Needed: +1 History of Present Illness: Pt with spinal stenosis in lumbar region now s/p decompression lumbar laminectomy at L4-5 and microdiscetomy at L3-4 left.        Prior Functioning  Home Living Family/patient expects to be discharged to:: Private residence Living Arrangements: Alone (howevr dtr to stay with patient these first few days) Available Help at Discharge: Family Type of Home: House Home Access: Stairs to enter Secretary/administrator of Steps: 3 Entrance Stairs-Rails: Right Home Layout: One level Home Equipment: None (can borrow from friends if need it) Prior Function Level of Independence: Independent Communication Communication: No difficulties    Cognition  Cognition Arousal/Alertness: Awake/alert Behavior During Therapy: WFL for tasks assessed/performed Overall Cognitive Status: Within Functional Limits for tasks assessed    Extremity/Trunk Assessment Lower Extremity Assessment Lower Extremity Assessment: Overall WFL for tasks assessed   Balance    End of Session PT - End of Session Patient left: in bed Nurse Communication: Mobility status  GP Functional Assessment Tool Used: clinical judgement Functional Limitation: Mobility: Walking and moving around Mobility: Walking and  Moving Around Current Status [phone]): At least 1 percent but less than 20 percent impaired, limited or restricted Mobility: Walking and Moving Around Goal Status [phone]): At least 1 percent  but less than 20 percent impaired, limited or restricted Mobility: Walking and Moving Around Discharge Status [phone]): At least 1 percent but less than 20 percent impaired, limited or restricted   Marella Bile 04/22/2013, 11:20 AM Marella Bile, PT Pager: [phone] 04/22/2013

## 2013-04-22 NOTE — Op Note (Signed)
[name] [name], [name] [name]             ACCOUNT NO.:  [id number]  MEDICAL RECORD NO.:  [id number]  LOCATION:  1606                         FACILITY:  [facility]  PHYSICIAN:  Georges Lynch. Tena Linebaugh, M.D.DATE OF BIRTH:  August 17, 1945  DATE OF PROCEDURE:  04/21/2013 DATE OF DISCHARGE:                              OPERATIVE REPORT   SURGEON:  Georges Lynch. Darrelyn Hillock, M.D.  ASSISTANT:  Marlowe Kays, M.D.  PREOPERATIVE DIAGNOSES: 1. Spinal stenosis at L4-5, left. 2. Herniated disk with a foraminal component, L4-5 on the left. 3. Weakness of the dorsiflexors of the left foot.  POSTOPERATIVE DIAGNOSES: 1. Spinal stenosis at L4-5, left. 2. Herniated disk with a foraminal component, L4-5 on the left. 3. Weakness of the dorsiflexors of the left foot.  OPERATION: 1. Complete decompressive lumbar laminectomy at L4-5. 2. Foraminotomies for the L4 root and the L5 root on the left. 3. Microdiskectomy at L3-L4 on the left.  DESCRIPTION OF PROCEDURE:  Under general anesthesia, routine orthopedic prep and draping was carried out with the patient on a spinal frame. She had clindamycin 900 mg.  At this time, after the appropriate time- out was carried out and after I marked the appropriate left side of her back in the holding area, we then placed 2 needles for localization purposes in the skin and subcu area.  X-ray was taken.  Once we established the position, an incision was made over the L4-5 interspace and extended proximally and distally.  The muscle then was stripped from the lamina and spinous processes bilaterally.  Bleeders were identified and cauterized.  Two instruments were placed in the back and another x- ray was taken.  Following that, we inserted the [facility] retractors. I then removed the spinous process of L4.  I went down and did a complete decompressive lumbar laminectomy at L4-L5.  A microscope was brought in.  We then completed our decompression proximally and distally, and went out  laterally on both sides.  We then with the aid of the microscope, gently removed the ligamentum flavum.  We elected to enter the foramen of L5 and the foramen for the L5 root and at the L4 root as well, did foraminotomies.  We then gently retracted the dura, identified the disk space.  A needle was placed in the disk space and x- ray was taken.  At this time, we then gently removed the needle with great care not to injure the dura.  We cauterized the lateral recess veins, which were rather large.  At that time, we utilized the D'Er[employer] retractor to gently retract the nerve root and the dura.  We then made a cruciate incision in the posterior longitudinal ligament and did a microdiskectomy in the usual fashion.  We went out laterally, medially, and proximal and distally as well.  We utilized the nerve hook and the Epstein curettes to get milk in the disk material out the subligamentous space then out of the foramen above.  Once we felt we had a good decompression, we went back and displaced again and removed the loose fragments of disk.  The disk specimen was sent to the lab.  We then utilized the hockey-stick and made sure that the foramen for  the 4 and 5 root were opened, and the nerve roots were free, which they were.  We thoroughly irrigated out the area, loosely applied some thrombin-soaked Gelfoam, and closed the wound layers in usual fashion.  Note, I did leave a small distal and proximal deep part of the wound open for drainage purposes as I usually do.  I then injected Exparel 20 mL into the muscle and subcu.  At the beginning of the case, I injected 20 mL of 0.5% Marcaine and epinephrine into the subcu and muscle to control bleeding.  After this, the subcu was closed with #1 Vicryl.  Skin was closed with metal staples and a sterile Neosporin dressing was applied. The patient left the operating room in a satisfactory condition.          ______________________________ Georges Lynch  Darrelyn Hillock, M.D.     RAG/MEDQ  D:  04/21/2013  T:  04/22/2013  Job:  [id number]

## 2013-04-22 NOTE — Progress Notes (Signed)
Pt stable, scripts, d/c instructions given with no questions/concerns voiced by pt or family.  Pt transported via wheelchair to private vehicle by NT and family. 

## 2013-04-24 ENCOUNTER — Encounter (HOSPITAL_COMMUNITY): Payer: Self-pay | Admitting: Orthopedic Surgery

## 2013-04-26 NOTE — Discharge Summary (Signed)
Physician Discharge Summary   Patient ID: [name] [name] MRN: [id number] DOB/AGE: 1946-05-24 67 y.o.  Admit date: 04/21/2013 Discharge date: 04/22/2013  Primary Diagnosis: Spinal stenosis, lumbar spine  Admission Diagnoses:  Past Medical History  Diagnosis Date  . Hypertension   . Hypothyroidism   . GERD (gastroesophageal reflux disease)   . DM type 2 (diabetes mellitus, type 2)   . DJD (degenerative joint disease)   . DDD (degenerative disc disease)   . Pancreatitis     h/o, twice   Discharge Diagnoses:   Active Problems:   Spinal stenosis, lumbar region, with neurogenic claudication  Estimated body mass index is 35.43 kg/(m^2) as calculated from the following:   Height as of this encounter: 5\' 1"  (1.549 m).   Weight as of this encounter: 85 kg (187 lb 6.3 oz).  Procedure:  Procedure(s) (LRB): CENTRAL MICRODISCECTOMY AND COMPLETE DECOMPRESSION OF LUMBAR LAMINECTOMY L4-L5  (N/A)   Consults: None  HPI: [name] [name] presented with the chief complaint of back pain. She has had pain in the low back that radiates into the left LE for over as year. She denies injury. She has had multiple injections which have provided minimal relief.The bottom line is that she has a central canal stenosis at L4-5. She has a disk herniation on the left at L4-5 that minimally displaces the L4 and also a ganglion. She has left lateral recess stenosis.   Laboratory Data: Admission on 04/21/2013, Discharged on 04/22/2013  Component Date Value Range Status  . Glucose-Capillary 04/21/2013 177* 70 - 99 mg/dL Final  . Comment 1 40/98/1191 Documented in Chart   Final  . Glucose-Capillary 04/21/2013 140* 70 - 99 mg/dL Final  . Comment 1 47/82/9562 Notify RN   Final  . Comment 2 04/21/2013 Documented in Chart   Final  . Glucose-Capillary 04/21/2013 147* 70 - 99 mg/dL Final  . Comment 1 13/01/6577 Notify RN   Final  . Comment 2 04/21/2013 Documented in Chart   Final  . Glucose-Capillary 04/21/2013 160* 70  - 99 mg/dL Final  . Glucose-Capillary 04/22/2013 172* 70 - 99 mg/dL Final  Hospital Outpatient Visit on 04/14/2013  Component Date Value Range Status  . aPTT 04/14/2013 30  24 - 37 seconds Final  . Sodium 04/14/2013 134* 135 - 145 mEq/L Final  . Potassium 04/14/2013 4.5  3.5 - 5.1 mEq/L Final  . Chloride 04/14/2013 98  96 - 112 mEq/L Final  . CO2 04/14/2013 29  19 - 32 mEq/L Final  . Glucose, Bld 04/14/2013 186* 70 - 99 mg/dL Final  . BUN 46/96/2952 11  6 - 23 mg/dL Final  . Creatinine, Ser 04/14/2013 0.63  0.50 - 1.10 mg/dL Final  . Calcium 84/13/2440 11.2* 8.4 - 10.5 mg/dL Final  . Total Protein 04/14/2013 8.2  6.0 - 8.3 g/dL Final  . Albumin 04/04/2535 4.1  3.5 - 5.2 g/dL Final  . AST 64/40/3474 16  0 - 37 U/L Final  . ALT 04/14/2013 27  0 - 35 U/L Final  . Alkaline Phosphatase 04/14/2013 60  39 - 117 U/L Final  . Total Bilirubin 04/14/2013 0.5  0.3 - 1.2 mg/dL Final  . GFR calc non Af Amer 04/14/2013 >90  >90 mL/min Final  . GFR calc Af Amer 04/14/2013 >90  >90 mL/min Final   Comment: (NOTE)                          The eGFR has been calculated using  the CKD EPI equation.                          This calculation has not been validated in all clinical situations.                          eGFR's persistently <90 mL/min signify possible Chronic Kidney                          Disease.  Marland Kitchen Prothrombin Time 04/14/2013 12.4  11.6 - 15.2 seconds Final  . INR 04/14/2013 0.94  0.00 - 1.49 Final  . Color, Urine 04/14/2013 YELLOW  YELLOW Final  . APPearance 04/14/2013 CLEAR  CLEAR Final  . Specific Gravity, Urine 04/14/2013 1.015  1.005 - 1.030 Final  . pH 04/14/2013 5.5  5.0 - 8.0 Final  . Glucose, UA 04/14/2013 NEGATIVE  NEGATIVE mg/dL Final  . Hgb urine dipstick 04/14/2013 NEGATIVE  NEGATIVE Final  . Bilirubin Urine 04/14/2013 NEGATIVE  NEGATIVE Final  . Ketones, ur 04/14/2013 NEGATIVE  NEGATIVE mg/dL Final  . Protein, ur 40/98/1191 NEGATIVE  NEGATIVE mg/dL Final  . Urobilinogen, UA  04/14/2013 0.2  0.0 - 1.0 mg/dL Final  . Nitrite 47/82/9562 NEGATIVE  NEGATIVE Final  . Leukocytes, UA 04/14/2013 NEGATIVE  NEGATIVE Final   MICROSCOPIC NOT DONE ON URINES WITH NEGATIVE PROTEIN, BLOOD, LEUKOCYTES, NITRITE, OR GLUCOSE <1000 mg/dL.  . WBC 04/14/2013 8.9  4.0 - 10.5 K/uL Final  . RBC 04/14/2013 4.60  3.87 - 5.11 MIL/uL Final  . Hemoglobin 04/14/2013 14.8  12.0 - 15.0 g/dL Final  . HCT 13/01/6577 44.3  36.0 - 46.0 % Final  . MCV 04/14/2013 96.3  78.0 - 100.0 fL Final  . MCH 04/14/2013 32.2  26.0 - 34.0 pg Final  . MCHC 04/14/2013 33.4  30.0 - 36.0 g/dL Final  . RDW 46/96/2952 12.9  11.5 - 15.5 % Final  . Platelets 04/14/2013 372  150 - 400 K/uL Final  . MRSA, PCR 04/14/2013 NEGATIVE  NEGATIVE Final  . Staphylococcus aureus 04/14/2013 POSITIVE* NEGATIVE Final   Comment:                                 The Xpert SA Assay (FDA                          approved for NASAL specimens                          in patients over 71 years of age),                          is one component of                          a comprehensive surveillance                          program.  Test performance has                          been validated by [employer] [facility] for patients greater                          than or equal to 30 year old.                          It is not intended                          to diagnose infection nor to                          guide or monitor treatment.     X-Rays:Dg Chest 2 View  04/14/2013   CLINICAL DATA:  Preop for lumbar spine surgery. Preoperative respiratory evaluation.  EXAM: CHEST  2 VIEW  COMPARISON:  03/30/2008  FINDINGS: The lungs are clear without focal infiltrate, edema, pneumothorax or pleural effusion. The cardiopericardial silhouette is within normal limits for size. Imaged bony structures of the thorax are intact.  IMPRESSION: No acute cardiopulmonary findings.   Electronically Signed   By: Kennith Center M.D.   On:  04/14/2013 10:24   Dg Lumbar Spine 2-3 Views  04/14/2013   CLINICAL DATA:  Preop for lumbar surgery.  EXAM: LUMBAR SPINE - 2-3 VIEW  COMPARISON:  04/06/2012  FINDINGS: There are 5 nonrib bearing lumbar-type vertebral bodies. The vertebral body heights are maintained. The alignment is anatomic. There is no spondylolysis. There is no acute fracture or static listhesis. The disc spaces are maintained. There are tiny discogenic endplate osteophytes at L3-4, L4-5 and L5-S1. There is bilateral severe facet arthropathy at L4-5 and L5-S1.  The SI joints are unremarkable.  IMPRESSION: Lumbar spine spondylosis as described above.   Electronically Signed   By: Elige Ko   On: 04/14/2013 10:28   Dg Spine Portable 1 View  04/21/2013   CLINICAL DATA:  L4-5 microdiskectomy  EXAM: PORTABLE SPINE - 1 VIEW  COMPARISON:  Film from 1401 hrs  FINDINGS: A needle is now noted within the L4-5 disc space.   Electronically Signed   By: Alcide Clever M.D.   On: 04/21/2013 14:20   Dg Spine Portable 1 View  04/21/2013   CLINICAL DATA:  Microdiscectomy at L4-5  EXAM: PORTABLE SPINE - 1 VIEW  COMPARISON:  Film from earlier in the same day.  FINDINGS: Five lumbar type vertebral bodies are again well visualized. Surgical instruments now lie within the lumbar spinal canal posterior to the L4 level. Surgical retractor is seen.   Electronically Signed   By: Alcide Clever M.D.   On: 04/21/2013 14:19   Dg Spine Portable 1 View  04/21/2013   CLINICAL DATA:  Micro discectomy at L4-5.  EXAM: PORTABLE SPINE - 1 VIEW  COMPARISON:  None.  FINDINGS: Single portable cross-table lateral radiograph of the lumbar spine is provided. There is a metallic probe with the tip at the L4-L5 facet. The vertebral body heights are maintained. There is mild degenerative disc disease at L4-5.  IMPRESSION: Intraoperative cross-table lateral view with a metallic probe at the level of the L4-5 facet.   Electronically Signed   By: Elige Ko   On: 04/21/2013  13:50   Dg Spine Portable 1 View  04/21/2013   CLINICAL DATA:  Microdiskectomy.  L4-5 level.  EXAM: PORTABLE SPINE - 1 VIEW  COMPARISON:  04/14/2013  FINDINGS: Film labeled 1 demonstrates  2 surgical instruments posterior to lumbar spine. These are identified posterior to the disc space at L4-5 and the L5 vertebral body. Mild degenerative changes are present. Imaged is numbered.  IMPRESSION: Intraoperative localization.   Electronically Signed   By: Rosalie Gums M.D.   On: 04/21/2013 13:49    EKG: Orders placed in visit on 03/02/13  . EKG 12-LEAD     Hospital Course: [name] [name] is a 67 y.o. who was admitted to [facility]. They were brought to the operating room on 04/21/2013 and underwent Procedure(s): CENTRAL MICRODISCECTOMY AND COMPLETE DECOMPRESSION OF LUMBAR LAMINECTOMY L4-L5 .  Patient tolerated the procedure well and was later transferred to the recovery room and then to the orthopaedic floor for postoperative care.  They were given PO and IV analgesics for pain control following their surgery.  They were given 24 hours of postoperative antibiotics of  Anti-infectives   Start     Dose/Rate Route Frequency Ordered Stop   04/21/13 2100  clindamycin (CLEOCIN) IVPB 600 mg     600 mg 100 mL/hr over 30 Minutes Intravenous 3 times per day 04/21/13 1619 04/22/13 0604   04/21/13 1325  polymyxin B 500,000 Units, bacitracin 50,000 Units in sodium chloride irrigation 0.9 % 500 mL irrigation  Status:  Discontinued       As needed 04/21/13 1325 04/21/13 1441   04/21/13 1000  clindamycin (CLEOCIN) IVPB 900 mg     900 mg 100 mL/hr over 30 Minutes Intravenous On call to O.R. 04/21/13 1000 04/21/13 1259     and started on DVT prophylaxis in the form of Aspirin.   PT was ordered.  Discharge planning consulted to help with postop disposition and equipment needs.  Patient had a fair night on the evening of surgery.  They started to get up OOB with therapy on day one. She did have some issues  with reflux overnight but improved once she got up. Patient was seen in rounds and was ready to go home.   Discharge Medications: Prior to Admission medications   Medication Sig Start Date End Date Taking? Authorizing Provider  acetaminophen (TYLENOL) 325 MG tablet Take 650 mg by mouth every 6 (six) hours as needed for mild pain, moderate pain or headache.   Yes Historical Provider, MD  aspirin 81 MG tablet Take 81 mg by mouth daily.   Yes Historical Provider, MD  atenolol (TENORMIN) 50 MG tablet Take 50 mg by mouth daily.   Yes Historical Provider, MD  benazepril (LOTENSIN) 20 MG tablet Take 20 mg by mouth daily. 12/28/12  Yes Historical Provider, MD  estradiol (ESTRACE) 0.5 MG tablet Take 1 tablet by mouth every other day.  12/28/12  Yes Historical Provider, MD  gemfibrozil (LOPID) 600 MG tablet Take 600 mg by mouth 2 (two) times daily before a meal.  02/27/13  Yes Historical Provider, MD  levothyroxine (SYNTHROID, LEVOTHROID) 88 MCG tablet Take 88 mcg by mouth daily before breakfast.   Yes Historical Provider, MD  metFORMIN (GLUCOPHAGE-XR) 500 MG 24 hr tablet Take 1 tablet by mouth 2 (two) times daily. 12/27/12  Yes Historical Provider, MD  Multiple Vitamin (MULTIVITAMIN) capsule Take 1 capsule by mouth every other day.    Yes Historical Provider, MD  omeprazole (PRILOSEC) 20 MG capsule Take 20 mg by mouth daily.   Yes Historical Provider, MD  traMADol (ULTRAM) 50 MG tablet Take by mouth every 6 (six) hours as needed for moderate pain or severe pain.   Yes Historical Provider,  MD  HYDROcodone-acetaminophen (NORCO) 10-325 MG per tablet Take 1 tablet by mouth every 4 (four) hours as needed for moderate pain or severe pain.  02/02/13   Historical Provider, MD    Diet: Diabetic diet Activity:WBAT Follow-up:in 2 weeks Disposition - Home Discharged Condition: stable   Discharge Orders   Future Orders Complete By Expires   Call MD / Call 911  As directed    Comments:     If you experience chest  pain or shortness of breath, CALL 911 and be transported to the hospital emergency room.  If you develope a fever above 101 F, pus (white drainage) or increased drainage or redness at the wound, or calf pain, call your surgeon's office.   Constipation Prevention  As directed    Comments:     Drink plenty of fluids.  Prune juice may be helpful.  You may use a stool softener, such as Colace (over the counter) 100 mg twice a day.  Use MiraLax (over the counter) for constipation as needed.   Diet - low sodium heart healthy  As directed    Driving restrictions  As directed    Comments:     No driving for 2 weeks   Increase activity slowly as tolerated  As directed    Lifting restrictions  As directed    Comments:     No lifting for 2 weeks       Medication List         acetaminophen 325 MG tablet  Commonly known as:  TYLENOL  Take 650 mg by mouth every 6 (six) hours as needed for mild pain, moderate pain or headache.     aspirin 81 MG tablet  Take 81 mg by mouth daily.     atenolol 50 MG tablet  Commonly known as:  TENORMIN  Take 50 mg by mouth daily.     benazepril 20 MG tablet  Commonly known as:  LOTENSIN  Take 20 mg by mouth daily.     estradiol 0.5 MG tablet  Commonly known as:  ESTRACE  Take 1 tablet by mouth every other day.     gemfibrozil 600 MG tablet  Commonly known as:  LOPID  Take 600 mg by mouth 2 (two) times daily before a meal.     HYDROcodone-acetaminophen 10-325 MG per tablet  Commonly known as:  NORCO  Take 1 tablet by mouth every 4 (four) hours as needed for moderate pain or severe pain.     levothyroxine 88 MCG tablet  Commonly known as:  SYNTHROID, LEVOTHROID  Take 88 mcg by mouth daily before breakfast.     metFORMIN 500 MG 24 hr tablet  Commonly known as:  GLUCOPHAGE-XR  Take 1 tablet by mouth 2 (two) times daily.     multivitamin capsule  Take 1 capsule by mouth every other day.     omeprazole 20 MG capsule  Commonly known as:  PRILOSEC    Take 20 mg by mouth daily.     traMADol 50 MG tablet  Commonly known as:  ULTRAM  Take by mouth every 6 (six) hours as needed for moderate pain or severe pain.         SignedCeledonio Savage, Mone Commisso LAUREN 04/26/2013, 12:55 PM

## 2013-05-18 DIAGNOSIS — Z1231 Encounter for screening mammogram for malignant neoplasm of breast: Secondary | ICD-10-CM | POA: Diagnosis not present

## 2013-06-09 DIAGNOSIS — E785 Hyperlipidemia, unspecified: Secondary | ICD-10-CM | POA: Diagnosis not present

## 2013-06-09 DIAGNOSIS — E039 Hypothyroidism, unspecified: Secondary | ICD-10-CM | POA: Diagnosis not present

## 2013-06-09 DIAGNOSIS — I10 Essential (primary) hypertension: Secondary | ICD-10-CM | POA: Diagnosis not present

## 2013-06-09 DIAGNOSIS — E119 Type 2 diabetes mellitus without complications: Secondary | ICD-10-CM | POA: Diagnosis not present

## 2013-06-12 DIAGNOSIS — D539 Nutritional anemia, unspecified: Secondary | ICD-10-CM | POA: Diagnosis not present

## 2013-06-13 ENCOUNTER — Encounter: Payer: Self-pay | Admitting: Internal Medicine

## 2013-06-13 DIAGNOSIS — Z0000 Encounter for general adult medical examination without abnormal findings: Secondary | ICD-10-CM | POA: Diagnosis not present

## 2013-06-13 DIAGNOSIS — E119 Type 2 diabetes mellitus without complications: Secondary | ICD-10-CM | POA: Diagnosis not present

## 2013-06-13 DIAGNOSIS — N951 Menopausal and female climacteric states: Secondary | ICD-10-CM | POA: Diagnosis not present

## 2013-06-13 DIAGNOSIS — E785 Hyperlipidemia, unspecified: Secondary | ICD-10-CM | POA: Diagnosis not present

## 2013-06-13 DIAGNOSIS — D539 Nutritional anemia, unspecified: Secondary | ICD-10-CM | POA: Diagnosis not present

## 2013-06-13 DIAGNOSIS — M25559 Pain in unspecified hip: Secondary | ICD-10-CM | POA: Diagnosis not present

## 2013-06-13 DIAGNOSIS — E039 Hypothyroidism, unspecified: Secondary | ICD-10-CM | POA: Diagnosis not present

## 2013-06-13 DIAGNOSIS — M5137 Other intervertebral disc degeneration, lumbosacral region: Secondary | ICD-10-CM | POA: Diagnosis not present

## 2013-06-14 DIAGNOSIS — Z1212 Encounter for screening for malignant neoplasm of rectum: Secondary | ICD-10-CM | POA: Diagnosis not present

## 2013-07-28 DIAGNOSIS — Z4789 Encounter for other orthopedic aftercare: Secondary | ICD-10-CM | POA: Diagnosis not present

## 2013-07-28 DIAGNOSIS — M5126 Other intervertebral disc displacement, lumbar region: Secondary | ICD-10-CM | POA: Diagnosis not present

## 2013-08-07 ENCOUNTER — Ambulatory Visit (AMBULATORY_SURGERY_CENTER): Payer: Self-pay | Admitting: *Deleted

## 2013-08-07 VITALS — Ht 61.5 in | Wt 186.8 lb

## 2013-08-07 DIAGNOSIS — Z1211 Encounter for screening for malignant neoplasm of colon: Secondary | ICD-10-CM

## 2013-08-07 MED ORDER — MOVIPREP 100 G PO SOLR: ORAL | Status: DC

## 2013-08-07 NOTE — Progress Notes (Signed)
Patient denies any allergies to eggs or soy. Patient denies any problems with anesthesia.  

## 2013-08-09 ENCOUNTER — Encounter: Payer: Self-pay | Admitting: Internal Medicine

## 2013-08-09 ENCOUNTER — Ambulatory Visit (AMBULATORY_SURGERY_CENTER): Payer: Medicare Other | Admitting: Internal Medicine

## 2013-08-09 VITALS — BP 147/66 | HR 57 | Temp 97.6°F | Resp 19 | Ht 61.5 in | Wt 186.0 lb

## 2013-08-09 DIAGNOSIS — E119 Type 2 diabetes mellitus without complications: Secondary | ICD-10-CM | POA: Diagnosis not present

## 2013-08-09 DIAGNOSIS — I10 Essential (primary) hypertension: Secondary | ICD-10-CM | POA: Diagnosis not present

## 2013-08-09 DIAGNOSIS — D126 Benign neoplasm of colon, unspecified: Secondary | ICD-10-CM

## 2013-08-09 DIAGNOSIS — Z1211 Encounter for screening for malignant neoplasm of colon: Secondary | ICD-10-CM | POA: Diagnosis not present

## 2013-08-09 DIAGNOSIS — K219 Gastro-esophageal reflux disease without esophagitis: Secondary | ICD-10-CM | POA: Diagnosis not present

## 2013-08-09 LAB — GLUCOSE, CAPILLARY: GLUCOSE-CAPILLARY: 116 mg/dL — AB (ref 70–99)

## 2013-08-09 MED ORDER — SODIUM CHLORIDE 0.9 % IV SOLN: 500.0000 mL | INTRAVENOUS | Status: DC

## 2013-08-09 NOTE — Patient Instructions (Signed)
YOU HAD AN ENDOSCOPIC PROCEDURE TODAY AT THE [facility]: Refer to the procedure report that was given to you for any specific questions about what was found during the examination.  If the procedure report does not answer your questions, please call your gastroenterologist to clarify.  If you requested that your care partner not be given the details of your procedure findings, then the procedure report has been included in a sealed envelope for you to review at your convenience later.  YOU SHOULD EXPECT: Some feelings of bloating in the abdomen. Passage of more gas than usual.  Walking can help get rid of the air that was put into your GI tract during the procedure and reduce the bloating. If you had a lower endoscopy (such as a colonoscopy or flexible sigmoidoscopy) you may notice spotting of blood in your stool or on the toilet paper. If you underwent a bowel prep for your procedure, then you may not have a normal bowel movement for a few days.  DIET: Your first meal following the procedure should be a light meal and then it is ok to progress to your normal diet.  A half-sandwich or bowl of soup is an example of a good first meal.  Heavy or fried foods are harder to digest and may make you feel nauseous or bloated.  Likewise meals heavy in dairy and vegetables can cause extra gas to form and this can also increase the bloating.  Drink plenty of fluids but you should avoid alcoholic beverages for 24 hours.  ACTIVITY: Your care partner should take you home directly after the procedure.  You should plan to take it easy, moving slowly for the rest of the day.  You can resume normal activity the day after the procedure however you should NOT DRIVE or use heavy machinery for 24 hours (because of the sedation medicines used during the test).    SYMPTOMS TO REPORT IMMEDIATELY: A gastroenterologist can be reached at any hour.  During normal business hours, 8:30 AM to 5:00 PM Monday through Friday,  call [phone].  After hours and on weekends, please call the GI answering service at [phone] who will take a message and have the physician on call contact you.   Following lower endoscopy (colonoscopy or flexible sigmoidoscopy):  Excessive amounts of blood in the stool  Significant tenderness or worsening of abdominal pains  Swelling of the abdomen that is new, acute  Fever of 100F or higher    FOLLOW UP: If any biopsies were taken you will be contacted by phone or by letter within the next 1-3 weeks.  Call your gastroenterologist if you have not heard about the biopsies in 3 weeks.  Our staff will call the home number listed on your records the next business day following your procedure to check on you and address any questions or concerns that you may have at that time regarding the information given to you following your procedure. This is a courtesy call and so if there is no answer at the home number and we have not heard from you through the emergency physician on call, we will assume that you have returned to your regular daily activities without incident.  SIGNATURES/CONFIDENTIALITY: You and/or your care partner have signed paperwork which will be entered into your electronic medical record.  These signatures attest to the fact that that the information above on your After Visit Summary has been reviewed and is understood.  Full responsibility of the confidentiality   of this discharge information lies with you and/or your care-partner.   Polyp, high fiber information given.

## 2013-08-09 NOTE — Progress Notes (Signed)
Report to pacu rn, vss bbs=clear 

## 2013-08-09 NOTE — Op Note (Signed)
[facility]  [address] [place], [id number]   COLONOSCOPY PROCEDURE REPORT  PATIENT: [name], [name]  MR#: [id number] BIRTHDATE: 1945/12/21 , 67  yrs. old GENDER: Female ENDOSCOPIST: Lafayette Dragon, MD REFERRED IH:[id number] Perini, M.D. PROCEDURE DATE:  08/09/2013 PROCEDURE:   Colonoscopy with snare polypectomy and Colonoscopy with cold biopsy polypectomy First Screening Colonoscopy - Avg.  risk and is 50 yrs.  old or older - No.  Prior Negative Screening - Now for repeat screening. 10 or more years since last screening  History of Adenoma - Now for follow-up colonoscopy & has been > or = to 3 yrs.  N/A  Polyps Removed Today? Yes. ASA CLASS:   Class II INDICATIONS:needing colonoscopy.  Last colon exam October 2003 was normal. MEDICATIONS: MAC sedation, administered by CRNA and propofol (Diprivan) 200mg  IV  DESCRIPTION OF PROCEDURE:   After the risks benefits and alternatives of the procedure were thoroughly explained, informed consent was obtained.  A digital rectal exam revealed no abnormalities of the rectum.   The [id number]  endoscope was introduced through the anus and advanced to the cecum, which was identified by both the appendix and ileocecal valve. No adverse events experienced.   The quality of the prep was good, using MoviPrep  The instrument was then slowly withdrawn as the colon was fully examined.      COLON FINDINGS: Three smooth sessile polyps ranging between 3-20mm in size were found in the ascending colon x1 and sigmoid colon.x2  A polypectomy was performed with a cold snare  ( ascending colon)and with cold forceps.( sigmoid polyps)  The resection was complete and the polyp tissue was completely retrieved.  Retroflexed views revealed no abnormalities. The time to cecum=2 minutes 55 seconds. Withdrawal time=13 minutes 04 seconds.  The scope was withdrawn and the procedure completed. COMPLICATIONS: There were no  complications.  ENDOSCOPIC IMPRESSION: Three sessile polyps ranging between 3-74mm in size were found in the ascending colon and sigmoid colon; polypectomy was performed with a cold snare and with cold forceps  RECOMMENDATIONS: 1.  Await pathology results 2.  high fiber diet Recall colonoscopy pending path report   eSigned:  Lafayette Dragon, MD 08/09/2013 11:33 AM   cc:   PATIENT NAME:  [name] [name], [name] [name] MR#: [id number]

## 2013-08-09 NOTE — Progress Notes (Signed)
Called to room to assist during endoscopic procedure.  Patient ID and intended procedure confirmed with present staff. Received instructions for my participation in the procedure from the performing physician.  

## 2013-08-10 ENCOUNTER — Telehealth: Payer: Self-pay | Admitting: *Deleted

## 2013-08-10 LAB — GLUCOSE, CAPILLARY: Glucose-Capillary: 135 mg/dL — ABNORMAL HIGH (ref 70–99)

## 2013-08-10 NOTE — Telephone Encounter (Signed)
  Follow up Call-  Call back number 08/09/2013  Post procedure Call Back phone  # [phone]  Permission to leave phone message Yes     Patient questions:  Busy signal x 2.

## 2013-08-11 DIAGNOSIS — I10 Essential (primary) hypertension: Secondary | ICD-10-CM | POA: Diagnosis not present

## 2013-08-11 DIAGNOSIS — M5126 Other intervertebral disc displacement, lumbar region: Secondary | ICD-10-CM | POA: Diagnosis not present

## 2013-08-14 ENCOUNTER — Encounter: Payer: Self-pay | Admitting: Internal Medicine

## 2013-08-17 ENCOUNTER — Encounter: Payer: Self-pay | Admitting: *Deleted

## 2013-08-18 DIAGNOSIS — M5126 Other intervertebral disc displacement, lumbar region: Secondary | ICD-10-CM | POA: Diagnosis not present

## 2013-08-25 DIAGNOSIS — M5126 Other intervertebral disc displacement, lumbar region: Secondary | ICD-10-CM | POA: Diagnosis not present

## 2013-08-26 DIAGNOSIS — M5126 Other intervertebral disc displacement, lumbar region: Secondary | ICD-10-CM | POA: Diagnosis not present

## 2013-09-01 DIAGNOSIS — M5126 Other intervertebral disc displacement, lumbar region: Secondary | ICD-10-CM | POA: Diagnosis not present

## 2013-11-09 DIAGNOSIS — M25519 Pain in unspecified shoulder: Secondary | ICD-10-CM | POA: Diagnosis not present

## 2013-11-27 DIAGNOSIS — M19019 Primary osteoarthritis, unspecified shoulder: Secondary | ICD-10-CM | POA: Diagnosis not present

## 2013-11-27 DIAGNOSIS — Z4789 Encounter for other orthopedic aftercare: Secondary | ICD-10-CM | POA: Diagnosis not present

## 2013-12-11 DIAGNOSIS — Z6829 Body mass index (BMI) 29.0-29.9, adult: Secondary | ICD-10-CM | POA: Diagnosis not present

## 2013-12-11 DIAGNOSIS — Z79899 Other long term (current) drug therapy: Secondary | ICD-10-CM | POA: Diagnosis not present

## 2013-12-11 DIAGNOSIS — E039 Hypothyroidism, unspecified: Secondary | ICD-10-CM | POA: Diagnosis not present

## 2013-12-11 DIAGNOSIS — IMO0001 Reserved for inherently not codable concepts without codable children: Secondary | ICD-10-CM | POA: Diagnosis not present

## 2013-12-11 DIAGNOSIS — I10 Essential (primary) hypertension: Secondary | ICD-10-CM | POA: Diagnosis not present

## 2013-12-26 DIAGNOSIS — M67919 Unspecified disorder of synovium and tendon, unspecified shoulder: Secondary | ICD-10-CM | POA: Diagnosis not present

## 2013-12-26 DIAGNOSIS — M19019 Primary osteoarthritis, unspecified shoulder: Secondary | ICD-10-CM | POA: Diagnosis not present

## 2014-01-03 DIAGNOSIS — I10 Essential (primary) hypertension: Secondary | ICD-10-CM | POA: Diagnosis not present

## 2014-01-03 DIAGNOSIS — IMO0001 Reserved for inherently not codable concepts without codable children: Secondary | ICD-10-CM | POA: Diagnosis not present

## 2014-01-05 DIAGNOSIS — M25519 Pain in unspecified shoulder: Secondary | ICD-10-CM | POA: Diagnosis not present

## 2014-01-09 DIAGNOSIS — M19019 Primary osteoarthritis, unspecified shoulder: Secondary | ICD-10-CM | POA: Diagnosis not present

## 2014-01-30 DIAGNOSIS — E119 Type 2 diabetes mellitus without complications: Secondary | ICD-10-CM | POA: Diagnosis not present

## 2014-01-31 DIAGNOSIS — M67919 Unspecified disorder of synovium and tendon, unspecified shoulder: Secondary | ICD-10-CM | POA: Diagnosis not present

## 2014-01-31 DIAGNOSIS — S46819A Strain of other muscles, fascia and tendons at shoulder and upper arm level, unspecified arm, initial encounter: Secondary | ICD-10-CM | POA: Diagnosis not present

## 2014-01-31 DIAGNOSIS — S43499A Other sprain of unspecified shoulder joint, initial encounter: Secondary | ICD-10-CM | POA: Diagnosis not present

## 2014-01-31 DIAGNOSIS — M719 Bursopathy, unspecified: Secondary | ICD-10-CM | POA: Diagnosis not present

## 2014-02-22 DIAGNOSIS — I10 Essential (primary) hypertension: Secondary | ICD-10-CM | POA: Diagnosis not present

## 2014-02-22 DIAGNOSIS — Z6833 Body mass index (BMI) 33.0-33.9, adult: Secondary | ICD-10-CM | POA: Diagnosis not present

## 2014-02-22 DIAGNOSIS — IMO0001 Reserved for inherently not codable concepts without codable children: Secondary | ICD-10-CM | POA: Diagnosis not present

## 2014-03-12 DIAGNOSIS — E1165 Type 2 diabetes mellitus with hyperglycemia: Secondary | ICD-10-CM | POA: Diagnosis not present

## 2014-03-12 DIAGNOSIS — I10 Essential (primary) hypertension: Secondary | ICD-10-CM | POA: Diagnosis not present

## 2014-03-12 DIAGNOSIS — Z23 Encounter for immunization: Secondary | ICD-10-CM | POA: Diagnosis not present

## 2014-03-12 DIAGNOSIS — E039 Hypothyroidism, unspecified: Secondary | ICD-10-CM | POA: Diagnosis not present

## 2014-03-19 DIAGNOSIS — M75101 Unspecified rotator cuff tear or rupture of right shoulder, not specified as traumatic: Secondary | ICD-10-CM | POA: Diagnosis not present

## 2014-03-23 DIAGNOSIS — M75101 Unspecified rotator cuff tear or rupture of right shoulder, not specified as traumatic: Secondary | ICD-10-CM | POA: Diagnosis not present

## 2014-03-27 DIAGNOSIS — M75101 Unspecified rotator cuff tear or rupture of right shoulder, not specified as traumatic: Secondary | ICD-10-CM | POA: Diagnosis not present

## 2014-03-30 DIAGNOSIS — M25511 Pain in right shoulder: Secondary | ICD-10-CM | POA: Diagnosis not present

## 2014-04-03 DIAGNOSIS — M25511 Pain in right shoulder: Secondary | ICD-10-CM | POA: Diagnosis not present

## 2014-04-05 DIAGNOSIS — M75101 Unspecified rotator cuff tear or rupture of right shoulder, not specified as traumatic: Secondary | ICD-10-CM | POA: Diagnosis not present

## 2014-04-10 DIAGNOSIS — M75101 Unspecified rotator cuff tear or rupture of right shoulder, not specified as traumatic: Secondary | ICD-10-CM | POA: Diagnosis not present

## 2014-04-12 DIAGNOSIS — M75101 Unspecified rotator cuff tear or rupture of right shoulder, not specified as traumatic: Secondary | ICD-10-CM | POA: Diagnosis not present

## 2014-04-18 DIAGNOSIS — M75101 Unspecified rotator cuff tear or rupture of right shoulder, not specified as traumatic: Secondary | ICD-10-CM | POA: Diagnosis not present

## 2014-04-20 DIAGNOSIS — M75101 Unspecified rotator cuff tear or rupture of right shoulder, not specified as traumatic: Secondary | ICD-10-CM | POA: Diagnosis not present

## 2014-04-24 DIAGNOSIS — M75101 Unspecified rotator cuff tear or rupture of right shoulder, not specified as traumatic: Secondary | ICD-10-CM | POA: Diagnosis not present

## 2014-04-26 DIAGNOSIS — M75101 Unspecified rotator cuff tear or rupture of right shoulder, not specified as traumatic: Secondary | ICD-10-CM | POA: Diagnosis not present

## 2014-04-30 DIAGNOSIS — M75101 Unspecified rotator cuff tear or rupture of right shoulder, not specified as traumatic: Secondary | ICD-10-CM | POA: Diagnosis not present

## 2014-05-02 DIAGNOSIS — M75101 Unspecified rotator cuff tear or rupture of right shoulder, not specified as traumatic: Secondary | ICD-10-CM | POA: Diagnosis not present

## 2014-05-08 DIAGNOSIS — M75101 Unspecified rotator cuff tear or rupture of right shoulder, not specified as traumatic: Secondary | ICD-10-CM | POA: Diagnosis not present

## 2014-05-10 DIAGNOSIS — M75101 Unspecified rotator cuff tear or rupture of right shoulder, not specified as traumatic: Secondary | ICD-10-CM | POA: Diagnosis not present

## 2014-05-14 DIAGNOSIS — Z4789 Encounter for other orthopedic aftercare: Secondary | ICD-10-CM | POA: Diagnosis not present

## 2014-05-16 DIAGNOSIS — M75101 Unspecified rotator cuff tear or rupture of right shoulder, not specified as traumatic: Secondary | ICD-10-CM | POA: Diagnosis not present

## 2014-05-22 DIAGNOSIS — M75101 Unspecified rotator cuff tear or rupture of right shoulder, not specified as traumatic: Secondary | ICD-10-CM | POA: Diagnosis not present

## 2014-05-24 DIAGNOSIS — I10 Essential (primary) hypertension: Secondary | ICD-10-CM | POA: Diagnosis not present

## 2014-05-24 DIAGNOSIS — E1165 Type 2 diabetes mellitus with hyperglycemia: Secondary | ICD-10-CM | POA: Diagnosis not present

## 2014-05-30 DIAGNOSIS — M75101 Unspecified rotator cuff tear or rupture of right shoulder, not specified as traumatic: Secondary | ICD-10-CM | POA: Diagnosis not present

## 2014-06-05 DIAGNOSIS — M75101 Unspecified rotator cuff tear or rupture of right shoulder, not specified as traumatic: Secondary | ICD-10-CM | POA: Diagnosis not present

## 2014-06-12 DIAGNOSIS — M75101 Unspecified rotator cuff tear or rupture of right shoulder, not specified as traumatic: Secondary | ICD-10-CM | POA: Diagnosis not present

## 2014-06-18 DIAGNOSIS — M75101 Unspecified rotator cuff tear or rupture of right shoulder, not specified as traumatic: Secondary | ICD-10-CM | POA: Diagnosis not present

## 2014-06-22 DIAGNOSIS — M75101 Unspecified rotator cuff tear or rupture of right shoulder, not specified as traumatic: Secondary | ICD-10-CM | POA: Diagnosis not present

## 2014-06-25 DIAGNOSIS — Z9889 Other specified postprocedural states: Secondary | ICD-10-CM | POA: Diagnosis not present

## 2014-06-25 DIAGNOSIS — Z4789 Encounter for other orthopedic aftercare: Secondary | ICD-10-CM | POA: Diagnosis not present

## 2014-07-05 DIAGNOSIS — Z9889 Other specified postprocedural states: Secondary | ICD-10-CM | POA: Diagnosis not present

## 2014-07-05 DIAGNOSIS — M19011 Primary osteoarthritis, right shoulder: Secondary | ICD-10-CM | POA: Diagnosis not present

## 2014-07-05 DIAGNOSIS — M75121 Complete rotator cuff tear or rupture of right shoulder, not specified as traumatic: Secondary | ICD-10-CM | POA: Diagnosis not present

## 2014-07-31 DIAGNOSIS — D539 Nutritional anemia, unspecified: Secondary | ICD-10-CM | POA: Diagnosis not present

## 2014-07-31 DIAGNOSIS — E039 Hypothyroidism, unspecified: Secondary | ICD-10-CM | POA: Diagnosis not present

## 2014-07-31 DIAGNOSIS — Z008 Encounter for other general examination: Secondary | ICD-10-CM | POA: Diagnosis not present

## 2014-07-31 DIAGNOSIS — E1165 Type 2 diabetes mellitus with hyperglycemia: Secondary | ICD-10-CM | POA: Diagnosis not present

## 2014-07-31 DIAGNOSIS — I10 Essential (primary) hypertension: Secondary | ICD-10-CM | POA: Diagnosis not present

## 2014-08-07 DIAGNOSIS — E039 Hypothyroidism, unspecified: Secondary | ICD-10-CM | POA: Diagnosis not present

## 2014-08-07 DIAGNOSIS — E785 Hyperlipidemia, unspecified: Secondary | ICD-10-CM | POA: Diagnosis not present

## 2014-08-07 DIAGNOSIS — Z6832 Body mass index (BMI) 32.0-32.9, adult: Secondary | ICD-10-CM | POA: Diagnosis not present

## 2014-08-07 DIAGNOSIS — Z1389 Encounter for screening for other disorder: Secondary | ICD-10-CM | POA: Diagnosis not present

## 2014-08-07 DIAGNOSIS — Z0000 Encounter for general adult medical examination without abnormal findings: Secondary | ICD-10-CM | POA: Diagnosis not present

## 2014-08-07 DIAGNOSIS — I10 Essential (primary) hypertension: Secondary | ICD-10-CM | POA: Diagnosis not present

## 2014-08-07 DIAGNOSIS — M5136 Other intervertebral disc degeneration, lumbar region: Secondary | ICD-10-CM | POA: Diagnosis not present

## 2014-08-07 DIAGNOSIS — N951 Menopausal and female climacteric states: Secondary | ICD-10-CM | POA: Diagnosis not present

## 2014-08-07 DIAGNOSIS — K219 Gastro-esophageal reflux disease without esophagitis: Secondary | ICD-10-CM | POA: Diagnosis not present

## 2014-08-07 DIAGNOSIS — M1990 Unspecified osteoarthritis, unspecified site: Secondary | ICD-10-CM | POA: Diagnosis not present

## 2014-08-07 DIAGNOSIS — E669 Obesity, unspecified: Secondary | ICD-10-CM | POA: Diagnosis not present

## 2014-08-07 DIAGNOSIS — E119 Type 2 diabetes mellitus without complications: Secondary | ICD-10-CM | POA: Diagnosis not present

## 2014-08-09 DIAGNOSIS — Z1212 Encounter for screening for malignant neoplasm of rectum: Secondary | ICD-10-CM | POA: Diagnosis not present

## 2014-08-22 DIAGNOSIS — Z1231 Encounter for screening mammogram for malignant neoplasm of breast: Secondary | ICD-10-CM | POA: Diagnosis not present

## 2014-08-23 DIAGNOSIS — E1165 Type 2 diabetes mellitus with hyperglycemia: Secondary | ICD-10-CM | POA: Diagnosis not present

## 2014-08-23 DIAGNOSIS — I10 Essential (primary) hypertension: Secondary | ICD-10-CM | POA: Diagnosis not present

## 2014-08-23 DIAGNOSIS — Z6832 Body mass index (BMI) 32.0-32.9, adult: Secondary | ICD-10-CM | POA: Diagnosis not present

## 2014-08-27 DIAGNOSIS — Z1289 Encounter for screening for malignant neoplasm of other sites: Secondary | ICD-10-CM | POA: Diagnosis not present

## 2014-09-29 DIAGNOSIS — Z23 Encounter for immunization: Secondary | ICD-10-CM | POA: Diagnosis not present

## 2014-11-15 DIAGNOSIS — M5410 Radiculopathy, site unspecified: Secondary | ICD-10-CM | POA: Diagnosis not present

## 2014-11-15 DIAGNOSIS — M542 Cervicalgia: Secondary | ICD-10-CM | POA: Diagnosis not present

## 2014-11-23 DIAGNOSIS — M4724 Other spondylosis with radiculopathy, thoracic region: Secondary | ICD-10-CM | POA: Diagnosis not present

## 2014-11-23 DIAGNOSIS — M4721 Other spondylosis with radiculopathy, occipito-atlanto-axial region: Secondary | ICD-10-CM | POA: Diagnosis not present

## 2014-11-23 DIAGNOSIS — M4722 Other spondylosis with radiculopathy, cervical region: Secondary | ICD-10-CM | POA: Diagnosis not present

## 2014-12-04 ENCOUNTER — Encounter: Payer: Self-pay | Admitting: Internal Medicine

## 2014-12-05 DIAGNOSIS — M542 Cervicalgia: Secondary | ICD-10-CM | POA: Diagnosis not present

## 2014-12-05 DIAGNOSIS — E785 Hyperlipidemia, unspecified: Secondary | ICD-10-CM | POA: Diagnosis not present

## 2014-12-05 DIAGNOSIS — E1165 Type 2 diabetes mellitus with hyperglycemia: Secondary | ICD-10-CM | POA: Diagnosis not present

## 2014-12-05 DIAGNOSIS — Z6832 Body mass index (BMI) 32.0-32.9, adult: Secondary | ICD-10-CM | POA: Diagnosis not present

## 2014-12-05 DIAGNOSIS — I10 Essential (primary) hypertension: Secondary | ICD-10-CM | POA: Diagnosis not present

## 2014-12-13 DIAGNOSIS — M79602 Pain in left arm: Secondary | ICD-10-CM | POA: Diagnosis not present

## 2014-12-13 DIAGNOSIS — M545 Low back pain: Secondary | ICD-10-CM | POA: Diagnosis not present

## 2014-12-13 DIAGNOSIS — M542 Cervicalgia: Secondary | ICD-10-CM | POA: Diagnosis not present

## 2014-12-13 DIAGNOSIS — M79605 Pain in left leg: Secondary | ICD-10-CM | POA: Diagnosis not present

## 2014-12-19 DIAGNOSIS — M79602 Pain in left arm: Secondary | ICD-10-CM | POA: Diagnosis not present

## 2014-12-19 DIAGNOSIS — M542 Cervicalgia: Secondary | ICD-10-CM | POA: Diagnosis not present

## 2014-12-19 DIAGNOSIS — M79605 Pain in left leg: Secondary | ICD-10-CM | POA: Diagnosis not present

## 2014-12-19 DIAGNOSIS — M545 Low back pain: Secondary | ICD-10-CM | POA: Diagnosis not present

## 2014-12-21 DIAGNOSIS — M542 Cervicalgia: Secondary | ICD-10-CM | POA: Diagnosis not present

## 2014-12-21 DIAGNOSIS — M79605 Pain in left leg: Secondary | ICD-10-CM | POA: Diagnosis not present

## 2014-12-21 DIAGNOSIS — M79602 Pain in left arm: Secondary | ICD-10-CM | POA: Diagnosis not present

## 2014-12-21 DIAGNOSIS — M545 Low back pain: Secondary | ICD-10-CM | POA: Diagnosis not present

## 2014-12-25 DIAGNOSIS — M79605 Pain in left leg: Secondary | ICD-10-CM | POA: Diagnosis not present

## 2014-12-25 DIAGNOSIS — M542 Cervicalgia: Secondary | ICD-10-CM | POA: Diagnosis not present

## 2014-12-25 DIAGNOSIS — M79602 Pain in left arm: Secondary | ICD-10-CM | POA: Diagnosis not present

## 2014-12-25 DIAGNOSIS — M545 Low back pain: Secondary | ICD-10-CM | POA: Diagnosis not present

## 2014-12-27 DIAGNOSIS — M79605 Pain in left leg: Secondary | ICD-10-CM | POA: Diagnosis not present

## 2014-12-27 DIAGNOSIS — M79602 Pain in left arm: Secondary | ICD-10-CM | POA: Diagnosis not present

## 2014-12-27 DIAGNOSIS — M542 Cervicalgia: Secondary | ICD-10-CM | POA: Diagnosis not present

## 2014-12-27 DIAGNOSIS — M545 Low back pain: Secondary | ICD-10-CM | POA: Diagnosis not present

## 2015-01-01 DIAGNOSIS — M542 Cervicalgia: Secondary | ICD-10-CM | POA: Diagnosis not present

## 2015-01-01 DIAGNOSIS — M5136 Other intervertebral disc degeneration, lumbar region: Secondary | ICD-10-CM | POA: Diagnosis not present

## 2015-01-01 DIAGNOSIS — M79605 Pain in left leg: Secondary | ICD-10-CM | POA: Diagnosis not present

## 2015-01-01 DIAGNOSIS — M79602 Pain in left arm: Secondary | ICD-10-CM | POA: Diagnosis not present

## 2015-01-01 DIAGNOSIS — M545 Low back pain: Secondary | ICD-10-CM | POA: Diagnosis not present

## 2015-01-01 DIAGNOSIS — M1712 Unilateral primary osteoarthritis, left knee: Secondary | ICD-10-CM | POA: Diagnosis not present

## 2015-01-03 DIAGNOSIS — M542 Cervicalgia: Secondary | ICD-10-CM | POA: Diagnosis not present

## 2015-01-03 DIAGNOSIS — M79605 Pain in left leg: Secondary | ICD-10-CM | POA: Diagnosis not present

## 2015-01-03 DIAGNOSIS — M545 Low back pain: Secondary | ICD-10-CM | POA: Diagnosis not present

## 2015-01-03 DIAGNOSIS — M79602 Pain in left arm: Secondary | ICD-10-CM | POA: Diagnosis not present

## 2015-01-09 DIAGNOSIS — M79602 Pain in left arm: Secondary | ICD-10-CM | POA: Diagnosis not present

## 2015-01-09 DIAGNOSIS — M79605 Pain in left leg: Secondary | ICD-10-CM | POA: Diagnosis not present

## 2015-01-09 DIAGNOSIS — M545 Low back pain: Secondary | ICD-10-CM | POA: Diagnosis not present

## 2015-01-09 DIAGNOSIS — M542 Cervicalgia: Secondary | ICD-10-CM | POA: Diagnosis not present

## 2015-01-14 DIAGNOSIS — M79602 Pain in left arm: Secondary | ICD-10-CM | POA: Diagnosis not present

## 2015-01-14 DIAGNOSIS — M542 Cervicalgia: Secondary | ICD-10-CM | POA: Diagnosis not present

## 2015-01-14 DIAGNOSIS — M545 Low back pain: Secondary | ICD-10-CM | POA: Diagnosis not present

## 2015-01-14 DIAGNOSIS — M79605 Pain in left leg: Secondary | ICD-10-CM | POA: Diagnosis not present

## 2015-01-16 DIAGNOSIS — M542 Cervicalgia: Secondary | ICD-10-CM | POA: Diagnosis not present

## 2015-01-16 DIAGNOSIS — M79602 Pain in left arm: Secondary | ICD-10-CM | POA: Diagnosis not present

## 2015-01-16 DIAGNOSIS — M79605 Pain in left leg: Secondary | ICD-10-CM | POA: Diagnosis not present

## 2015-01-16 DIAGNOSIS — M545 Low back pain: Secondary | ICD-10-CM | POA: Diagnosis not present

## 2015-01-21 DIAGNOSIS — M545 Low back pain: Secondary | ICD-10-CM | POA: Diagnosis not present

## 2015-01-21 DIAGNOSIS — M542 Cervicalgia: Secondary | ICD-10-CM | POA: Diagnosis not present

## 2015-01-21 DIAGNOSIS — M79605 Pain in left leg: Secondary | ICD-10-CM | POA: Diagnosis not present

## 2015-01-21 DIAGNOSIS — M79602 Pain in left arm: Secondary | ICD-10-CM | POA: Diagnosis not present

## 2015-01-24 DIAGNOSIS — E119 Type 2 diabetes mellitus without complications: Secondary | ICD-10-CM | POA: Diagnosis not present

## 2015-01-24 DIAGNOSIS — M79602 Pain in left arm: Secondary | ICD-10-CM | POA: Diagnosis not present

## 2015-01-24 DIAGNOSIS — M545 Low back pain: Secondary | ICD-10-CM | POA: Diagnosis not present

## 2015-01-24 DIAGNOSIS — M79605 Pain in left leg: Secondary | ICD-10-CM | POA: Diagnosis not present

## 2015-01-24 DIAGNOSIS — M542 Cervicalgia: Secondary | ICD-10-CM | POA: Diagnosis not present

## 2015-01-24 DIAGNOSIS — Z0100 Encounter for examination of eyes and vision without abnormal findings: Secondary | ICD-10-CM | POA: Diagnosis not present

## 2015-01-30 DIAGNOSIS — M545 Low back pain: Secondary | ICD-10-CM | POA: Diagnosis not present

## 2015-01-30 DIAGNOSIS — M79602 Pain in left arm: Secondary | ICD-10-CM | POA: Diagnosis not present

## 2015-01-30 DIAGNOSIS — M542 Cervicalgia: Secondary | ICD-10-CM | POA: Diagnosis not present

## 2015-01-30 DIAGNOSIS — M79605 Pain in left leg: Secondary | ICD-10-CM | POA: Diagnosis not present

## 2015-02-04 DIAGNOSIS — Z23 Encounter for immunization: Secondary | ICD-10-CM | POA: Diagnosis not present

## 2015-02-04 DIAGNOSIS — Z6832 Body mass index (BMI) 32.0-32.9, adult: Secondary | ICD-10-CM | POA: Diagnosis not present

## 2015-02-04 DIAGNOSIS — G609 Hereditary and idiopathic neuropathy, unspecified: Secondary | ICD-10-CM | POA: Diagnosis not present

## 2015-02-07 DIAGNOSIS — M545 Low back pain: Secondary | ICD-10-CM | POA: Diagnosis not present

## 2015-02-07 DIAGNOSIS — M79602 Pain in left arm: Secondary | ICD-10-CM | POA: Diagnosis not present

## 2015-02-07 DIAGNOSIS — M79605 Pain in left leg: Secondary | ICD-10-CM | POA: Diagnosis not present

## 2015-02-07 DIAGNOSIS — M542 Cervicalgia: Secondary | ICD-10-CM | POA: Diagnosis not present

## 2015-02-14 DIAGNOSIS — M79602 Pain in left arm: Secondary | ICD-10-CM | POA: Diagnosis not present

## 2015-02-14 DIAGNOSIS — M79605 Pain in left leg: Secondary | ICD-10-CM | POA: Diagnosis not present

## 2015-02-14 DIAGNOSIS — M545 Low back pain: Secondary | ICD-10-CM | POA: Diagnosis not present

## 2015-02-14 DIAGNOSIS — M542 Cervicalgia: Secondary | ICD-10-CM | POA: Diagnosis not present

## 2015-02-27 DIAGNOSIS — M1712 Unilateral primary osteoarthritis, left knee: Secondary | ICD-10-CM | POA: Diagnosis not present

## 2015-03-05 DIAGNOSIS — M1712 Unilateral primary osteoarthritis, left knee: Secondary | ICD-10-CM | POA: Diagnosis not present

## 2015-03-07 DIAGNOSIS — M542 Cervicalgia: Secondary | ICD-10-CM | POA: Diagnosis not present

## 2015-03-07 DIAGNOSIS — M79602 Pain in left arm: Secondary | ICD-10-CM | POA: Diagnosis not present

## 2015-03-07 DIAGNOSIS — M545 Low back pain: Secondary | ICD-10-CM | POA: Diagnosis not present

## 2015-03-07 DIAGNOSIS — M79605 Pain in left leg: Secondary | ICD-10-CM | POA: Diagnosis not present

## 2015-04-08 DIAGNOSIS — M5136 Other intervertebral disc degeneration, lumbar region: Secondary | ICD-10-CM | POA: Diagnosis not present

## 2015-04-08 DIAGNOSIS — M1990 Unspecified osteoarthritis, unspecified site: Secondary | ICD-10-CM | POA: Diagnosis not present

## 2015-04-08 DIAGNOSIS — Z6832 Body mass index (BMI) 32.0-32.9, adult: Secondary | ICD-10-CM | POA: Diagnosis not present

## 2015-04-08 DIAGNOSIS — G609 Hereditary and idiopathic neuropathy, unspecified: Secondary | ICD-10-CM | POA: Diagnosis not present

## 2015-04-08 DIAGNOSIS — E119 Type 2 diabetes mellitus without complications: Secondary | ICD-10-CM | POA: Diagnosis not present

## 2015-04-08 DIAGNOSIS — I10 Essential (primary) hypertension: Secondary | ICD-10-CM | POA: Diagnosis not present

## 2015-04-08 DIAGNOSIS — Z1389 Encounter for screening for other disorder: Secondary | ICD-10-CM | POA: Diagnosis not present

## 2015-06-03 IMAGING — CR DG SPINE 1V PORT
1 series · 1 of 1 positions shown · non-contrast
Comparison: None.

CLINICAL DATA: Micro discectomy at L4-5.

EXAM:
PORTABLE SPINE - 1 VIEW

[lateral]
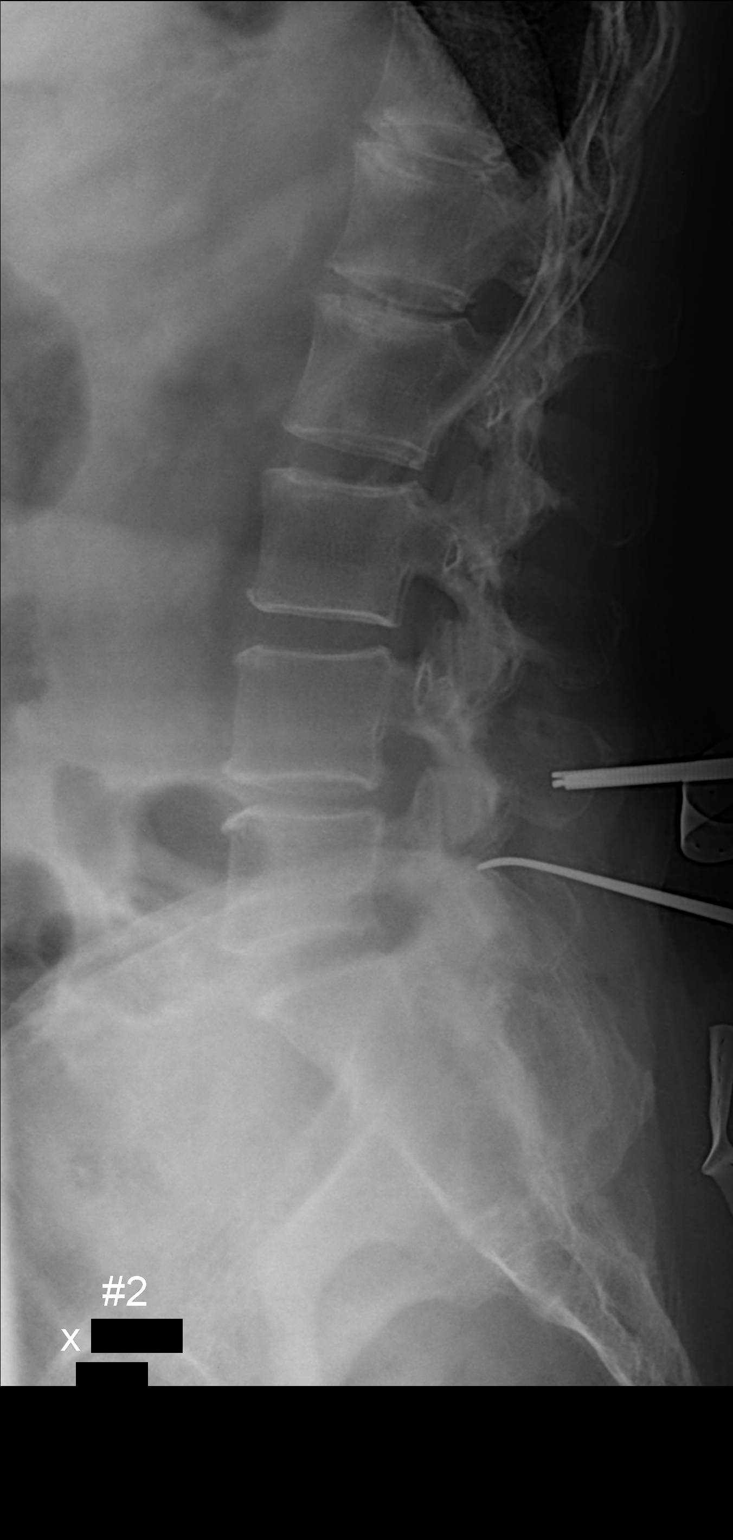

[1 of 1 positions shown; findings below may reference images not displayed]

FINDINGS: Single portable cross-table lateral radiograph of the lumbar spine
is provided. There is a metallic probe with the tip at the L4-L5
facet. The vertebral body heights are maintained. There is mild
degenerative disc disease at L4-5.
IMPRESSION: Intraoperative cross-table lateral view with a metallic probe at the
level of the L4-5 facet.

## 2015-06-03 IMAGING — CR DG SPINE 1V PORT
1 series · 1 of 1 positions shown · non-contrast
Comparison: Film from 1551 hrs

CLINICAL DATA: L4-5 microdiskectomy

EXAM:
PORTABLE SPINE - 1 VIEW

[lateral]
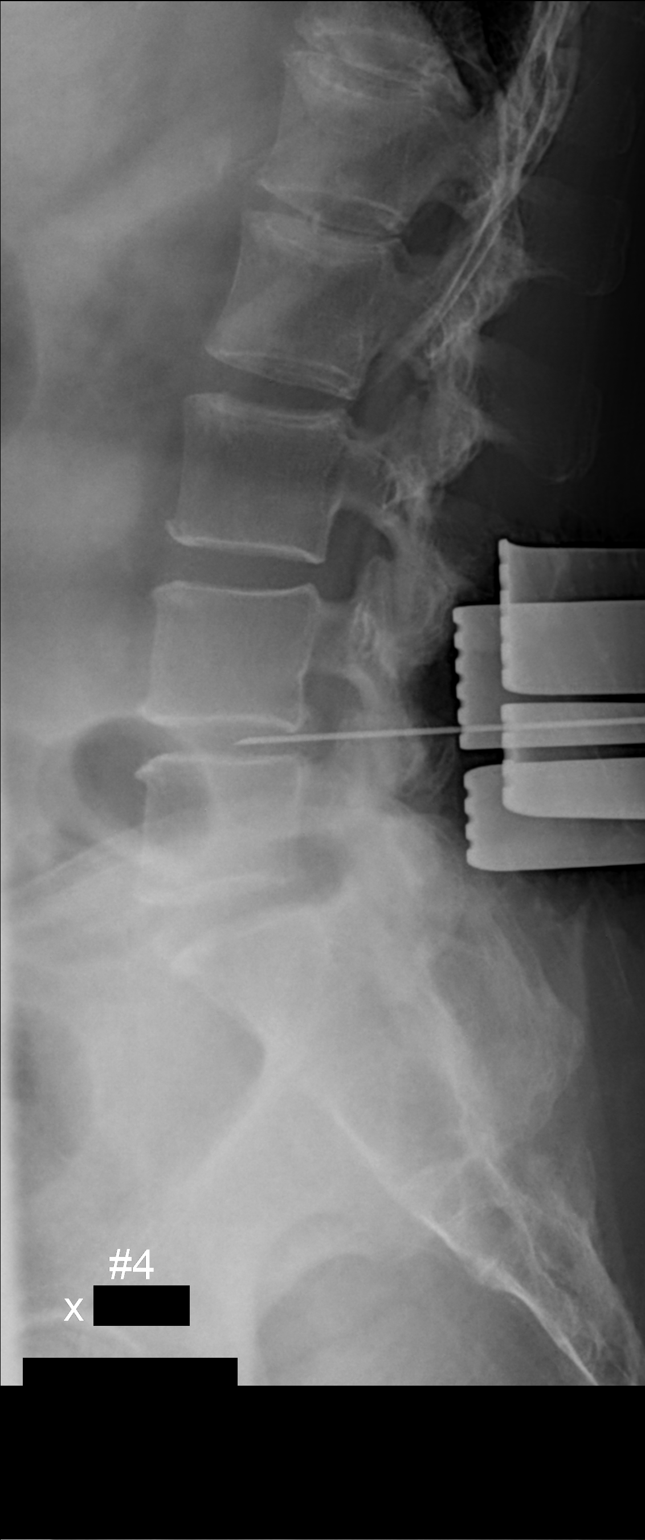

[1 of 1 positions shown; findings below may reference images not displayed]

FINDINGS: A needle is now noted within the L4-5 disc space.

## 2015-06-03 IMAGING — CR DG SPINE 1V PORT
1 series · 1 of 1 positions shown · non-contrast
Comparison: Film from earlier in the same day.

CLINICAL DATA: Microdiscectomy at L4-5

EXAM:
PORTABLE SPINE - 1 VIEW

[lateral]
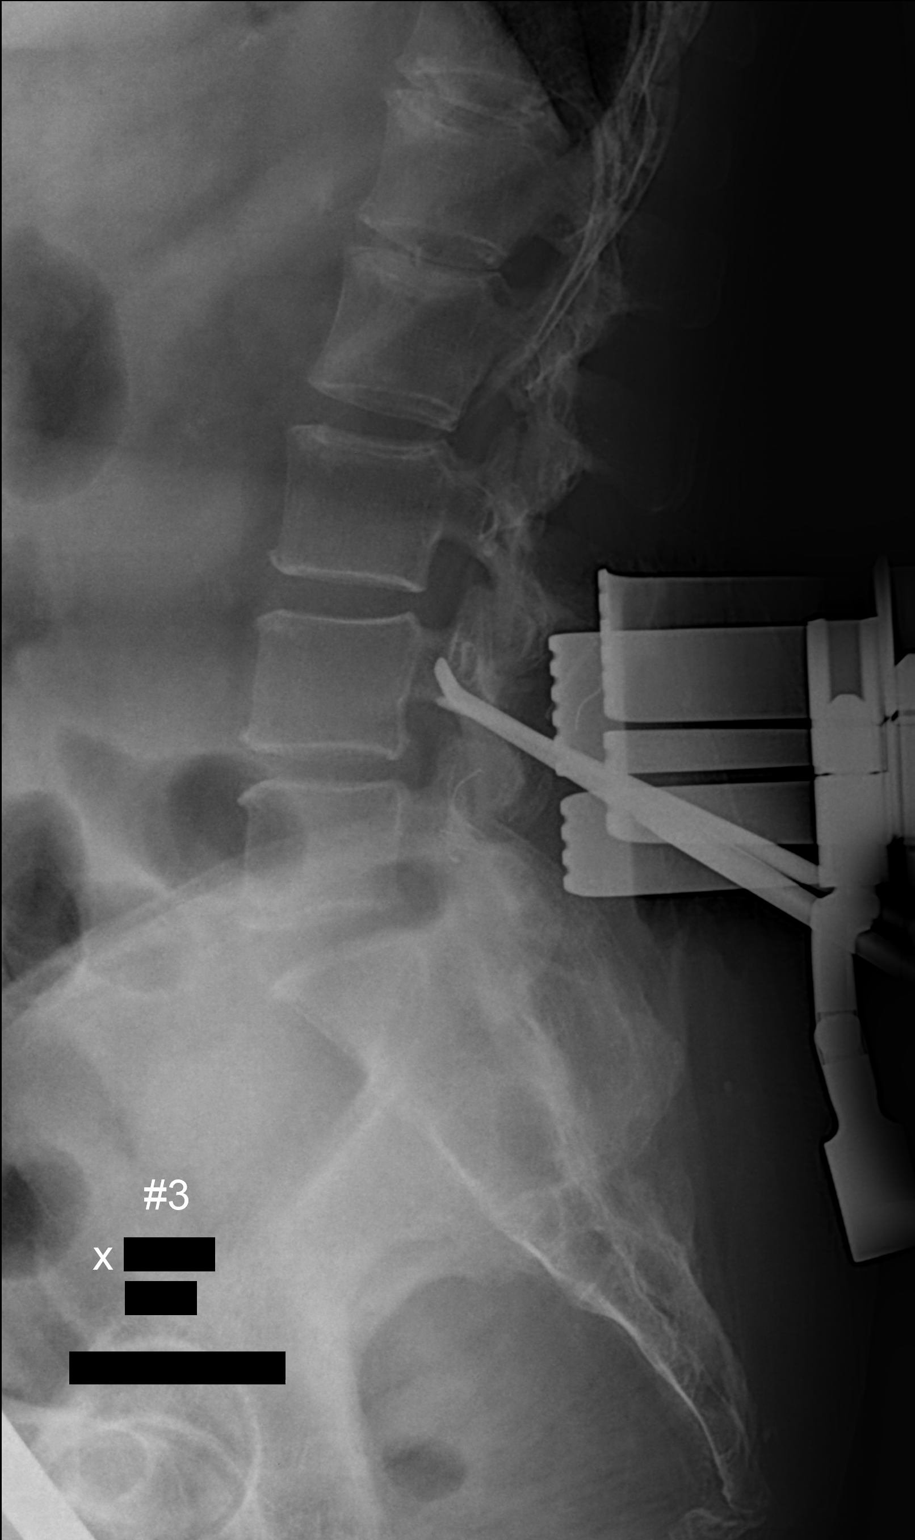

[1 of 1 positions shown; findings below may reference images not displayed]

FINDINGS: Five lumbar type vertebral bodies are again well visualized.
Surgical instruments now lie within the lumbar spinal canal
posterior to the L4 level. Surgical retractor is seen.

## 2015-06-03 IMAGING — CR DG SPINE 1V PORT
1 series · 1 of 1 positions shown · non-contrast
Comparison: 04/14/2013

CLINICAL DATA: Microdiskectomy.  L4-5 level.

EXAM:
PORTABLE SPINE - 1 VIEW

[lateral]
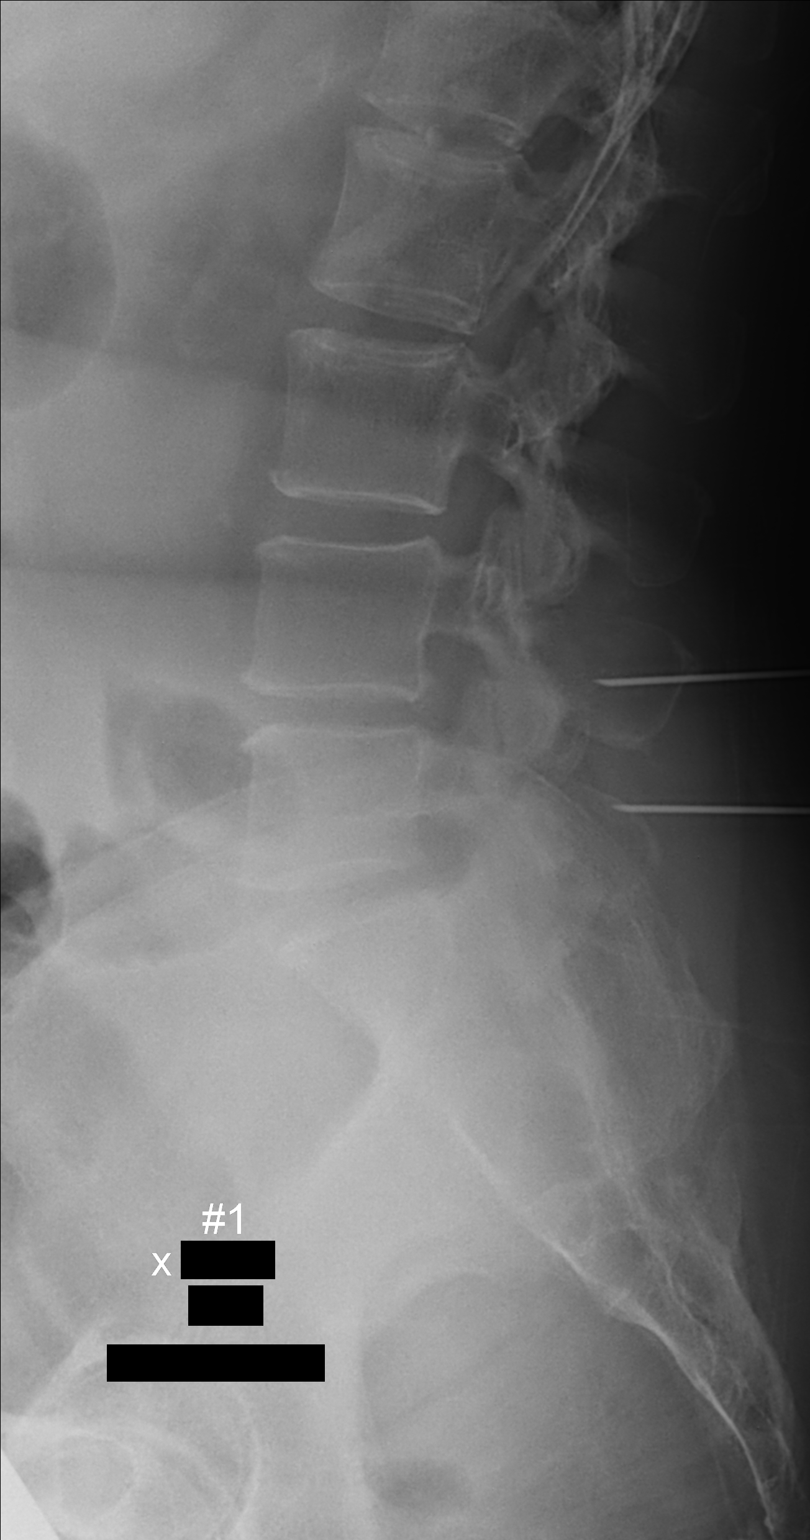

[1 of 1 positions shown; findings below may reference images not displayed]

FINDINGS: Film labeled 1 demonstrates 2 surgical instruments posterior to
lumbar spine. These are identified posterior to the disc space at
L4-5 and the L5 vertebral body. Mild degenerative changes are
present. Imaged is numbered.
IMPRESSION: Intraoperative localization.

## 2015-07-29 DIAGNOSIS — M25311 Other instability, right shoulder: Secondary | ICD-10-CM | POA: Diagnosis not present

## 2015-07-29 DIAGNOSIS — M1712 Unilateral primary osteoarthritis, left knee: Secondary | ICD-10-CM | POA: Diagnosis not present

## 2015-07-29 DIAGNOSIS — M7501 Adhesive capsulitis of right shoulder: Secondary | ICD-10-CM | POA: Diagnosis not present

## 2015-08-07 DIAGNOSIS — M25311 Other instability, right shoulder: Secondary | ICD-10-CM | POA: Diagnosis not present

## 2015-08-09 DIAGNOSIS — M25311 Other instability, right shoulder: Secondary | ICD-10-CM | POA: Diagnosis not present

## 2015-08-12 DIAGNOSIS — M25311 Other instability, right shoulder: Secondary | ICD-10-CM | POA: Diagnosis not present

## 2015-08-14 DIAGNOSIS — M25311 Other instability, right shoulder: Secondary | ICD-10-CM | POA: Diagnosis not present

## 2015-08-19 DIAGNOSIS — M25311 Other instability, right shoulder: Secondary | ICD-10-CM | POA: Diagnosis not present

## 2015-08-21 DIAGNOSIS — M25311 Other instability, right shoulder: Secondary | ICD-10-CM | POA: Diagnosis not present

## 2015-08-26 DIAGNOSIS — M25311 Other instability, right shoulder: Secondary | ICD-10-CM | POA: Diagnosis not present

## 2015-08-28 DIAGNOSIS — M25311 Other instability, right shoulder: Secondary | ICD-10-CM | POA: Diagnosis not present

## 2015-09-02 DIAGNOSIS — M25311 Other instability, right shoulder: Secondary | ICD-10-CM | POA: Diagnosis not present

## 2015-09-04 DIAGNOSIS — M25311 Other instability, right shoulder: Secondary | ICD-10-CM | POA: Diagnosis not present

## 2015-09-13 DIAGNOSIS — Z803 Family history of malignant neoplasm of breast: Secondary | ICD-10-CM | POA: Diagnosis not present

## 2015-09-13 DIAGNOSIS — Z1231 Encounter for screening mammogram for malignant neoplasm of breast: Secondary | ICD-10-CM | POA: Diagnosis not present

## 2015-09-24 DIAGNOSIS — E1165 Type 2 diabetes mellitus with hyperglycemia: Secondary | ICD-10-CM | POA: Diagnosis not present

## 2015-09-24 DIAGNOSIS — D539 Nutritional anemia, unspecified: Secondary | ICD-10-CM | POA: Diagnosis not present

## 2015-09-24 DIAGNOSIS — E784 Other hyperlipidemia: Secondary | ICD-10-CM | POA: Diagnosis not present

## 2015-09-24 DIAGNOSIS — E038 Other specified hypothyroidism: Secondary | ICD-10-CM | POA: Diagnosis not present

## 2015-09-24 DIAGNOSIS — I10 Essential (primary) hypertension: Secondary | ICD-10-CM | POA: Diagnosis not present

## 2015-10-01 DIAGNOSIS — E1140 Type 2 diabetes mellitus with diabetic neuropathy, unspecified: Secondary | ICD-10-CM | POA: Diagnosis not present

## 2015-10-01 DIAGNOSIS — M79604 Pain in right leg: Secondary | ICD-10-CM | POA: Diagnosis not present

## 2015-10-01 DIAGNOSIS — Z6834 Body mass index (BMI) 34.0-34.9, adult: Secondary | ICD-10-CM | POA: Diagnosis not present

## 2015-10-01 DIAGNOSIS — Z0000 Encounter for general adult medical examination without abnormal findings: Secondary | ICD-10-CM | POA: Diagnosis not present

## 2015-10-01 DIAGNOSIS — M79605 Pain in left leg: Secondary | ICD-10-CM | POA: Diagnosis not present

## 2015-10-01 DIAGNOSIS — M25551 Pain in right hip: Secondary | ICD-10-CM | POA: Diagnosis not present

## 2015-10-01 DIAGNOSIS — Z1389 Encounter for screening for other disorder: Secondary | ICD-10-CM | POA: Diagnosis not present

## 2015-10-01 DIAGNOSIS — D538 Other specified nutritional anemias: Secondary | ICD-10-CM | POA: Diagnosis not present

## 2015-10-01 DIAGNOSIS — G608 Other hereditary and idiopathic neuropathies: Secondary | ICD-10-CM | POA: Diagnosis not present

## 2015-10-01 DIAGNOSIS — M5136 Other intervertebral disc degeneration, lumbar region: Secondary | ICD-10-CM | POA: Diagnosis not present

## 2015-10-01 DIAGNOSIS — M542 Cervicalgia: Secondary | ICD-10-CM | POA: Diagnosis not present

## 2015-10-01 DIAGNOSIS — E784 Other hyperlipidemia: Secondary | ICD-10-CM | POA: Diagnosis not present

## 2015-10-02 DIAGNOSIS — Z1212 Encounter for screening for malignant neoplasm of rectum: Secondary | ICD-10-CM | POA: Diagnosis not present

## 2016-01-10 DIAGNOSIS — E1140 Type 2 diabetes mellitus with diabetic neuropathy, unspecified: Secondary | ICD-10-CM | POA: Diagnosis not present

## 2016-02-05 DIAGNOSIS — Z0100 Encounter for examination of eyes and vision without abnormal findings: Secondary | ICD-10-CM | POA: Diagnosis not present

## 2016-02-05 DIAGNOSIS — E119 Type 2 diabetes mellitus without complications: Secondary | ICD-10-CM | POA: Diagnosis not present

## 2016-03-09 DIAGNOSIS — E784 Other hyperlipidemia: Secondary | ICD-10-CM | POA: Diagnosis not present

## 2016-03-09 DIAGNOSIS — E038 Other specified hypothyroidism: Secondary | ICD-10-CM | POA: Diagnosis not present

## 2016-03-09 DIAGNOSIS — Z23 Encounter for immunization: Secondary | ICD-10-CM | POA: Diagnosis not present

## 2016-03-09 DIAGNOSIS — E1140 Type 2 diabetes mellitus with diabetic neuropathy, unspecified: Secondary | ICD-10-CM | POA: Diagnosis not present

## 2016-03-09 DIAGNOSIS — Z6833 Body mass index (BMI) 33.0-33.9, adult: Secondary | ICD-10-CM | POA: Diagnosis not present

## 2016-03-09 DIAGNOSIS — I10 Essential (primary) hypertension: Secondary | ICD-10-CM | POA: Diagnosis not present

## 2016-09-14 DIAGNOSIS — Z803 Family history of malignant neoplasm of breast: Secondary | ICD-10-CM | POA: Diagnosis not present

## 2016-09-14 DIAGNOSIS — Z1231 Encounter for screening mammogram for malignant neoplasm of breast: Secondary | ICD-10-CM | POA: Diagnosis not present

## 2016-10-13 DIAGNOSIS — E784 Other hyperlipidemia: Secondary | ICD-10-CM | POA: Diagnosis not present

## 2016-10-13 DIAGNOSIS — D538 Other specified nutritional anemias: Secondary | ICD-10-CM | POA: Diagnosis not present

## 2016-10-13 DIAGNOSIS — D539 Nutritional anemia, unspecified: Secondary | ICD-10-CM | POA: Diagnosis not present

## 2016-10-13 DIAGNOSIS — E038 Other specified hypothyroidism: Secondary | ICD-10-CM | POA: Diagnosis not present

## 2016-10-13 DIAGNOSIS — I10 Essential (primary) hypertension: Secondary | ICD-10-CM | POA: Diagnosis not present

## 2016-10-13 DIAGNOSIS — E1140 Type 2 diabetes mellitus with diabetic neuropathy, unspecified: Secondary | ICD-10-CM | POA: Diagnosis not present

## 2016-10-20 DIAGNOSIS — M79606 Pain in leg, unspecified: Secondary | ICD-10-CM | POA: Diagnosis not present

## 2016-10-20 DIAGNOSIS — Z0000 Encounter for general adult medical examination without abnormal findings: Secondary | ICD-10-CM | POA: Diagnosis not present

## 2016-10-20 DIAGNOSIS — M542 Cervicalgia: Secondary | ICD-10-CM | POA: Diagnosis not present

## 2016-10-20 DIAGNOSIS — N951 Menopausal and female climacteric states: Secondary | ICD-10-CM | POA: Diagnosis not present

## 2016-10-20 DIAGNOSIS — E1140 Type 2 diabetes mellitus with diabetic neuropathy, unspecified: Secondary | ICD-10-CM | POA: Diagnosis not present

## 2016-10-20 DIAGNOSIS — Z1389 Encounter for screening for other disorder: Secondary | ICD-10-CM | POA: Diagnosis not present

## 2016-10-20 DIAGNOSIS — M5136 Other intervertebral disc degeneration, lumbar region: Secondary | ICD-10-CM | POA: Diagnosis not present

## 2016-10-20 DIAGNOSIS — G608 Other hereditary and idiopathic neuropathies: Secondary | ICD-10-CM | POA: Diagnosis not present

## 2016-10-20 DIAGNOSIS — E784 Other hyperlipidemia: Secondary | ICD-10-CM | POA: Diagnosis not present

## 2016-10-20 DIAGNOSIS — E038 Other specified hypothyroidism: Secondary | ICD-10-CM | POA: Diagnosis not present

## 2016-10-20 DIAGNOSIS — Z1231 Encounter for screening mammogram for malignant neoplasm of breast: Secondary | ICD-10-CM | POA: Diagnosis not present

## 2016-10-20 DIAGNOSIS — Z6833 Body mass index (BMI) 33.0-33.9, adult: Secondary | ICD-10-CM | POA: Diagnosis not present

## 2016-10-21 DIAGNOSIS — Z1212 Encounter for screening for malignant neoplasm of rectum: Secondary | ICD-10-CM | POA: Diagnosis not present

## 2016-11-11 ENCOUNTER — Encounter: Payer: Self-pay | Admitting: Podiatry

## 2016-11-11 ENCOUNTER — Ambulatory Visit (INDEPENDENT_AMBULATORY_CARE_PROVIDER_SITE_OTHER): Payer: Medicare Other | Admitting: Podiatry

## 2016-11-11 DIAGNOSIS — L608 Other nail disorders: Secondary | ICD-10-CM | POA: Diagnosis not present

## 2016-11-11 DIAGNOSIS — M79672 Pain in left foot: Secondary | ICD-10-CM | POA: Diagnosis not present

## 2016-11-11 DIAGNOSIS — E119 Type 2 diabetes mellitus without complications: Secondary | ICD-10-CM | POA: Diagnosis not present

## 2016-11-11 NOTE — Progress Notes (Signed)
   Subjective:    Patient ID: [name] [name], female    DOB: 09/22/1945, 71 y.o.   MRN: [id number]  HPI   r  Patient presents primarily concerned about a painful medial margin of the left hallux toenail uncomfortable last 7 months walking wearing shoes. Patient has attempted to trim the margin without reduction of symptoms. She denies any history of infection around the nail. She is also complaining of plantar calluses bilaterally present for multiple months without any self treatment  Patient is a type II diabetic and denies history of claudication, amputation or ulceration Patient denies smoking history  Patient has a history of polymyalgia rheumatica   Review of Systems  All other systems reviewed and are negative.      Objective:   Physical Exam  Orientated 3  Vascular: DP and PT pulses 2/4 bilaterally Capillary reflex immediate bilaterally  Neurological: Sensation to 10 g monofilament wire intact 5/5 bilaterally Vibratory sensation reactive bilaterally Ankle reflex equal and reactive bilaterally  Dermatological: No open skin lesions bilaterally Atrophic skin without hair growth bilaterally Incurvated medial margin left hallux toenail with palpable tenderness Plantar callus fifth MPJ bilaterally and subsecond MPJ right  Musculoskeletal: HAV bilaterally Manual motor says he dorsi flexion, plantar flexion, inversion, eversion 5/5 bilaterally      Assessment & Plan:   Assessment: Diabetic with satisfactory neurovascular status Incurvated medial margin left hallux toenail Plantar calluses 3  Plan: Discuss treatment options today and offer patient debridement of the medial margin left hallux toenail with the understanding if this does not provide adequate a long-standing relief would offer permanent nail surgery she verbally consents. The medial margin of left hallux toenail was debrided without any bleeding. No evidence of purulent drainage Plantar  calluses 3 debrided without bleeding  Reappoint at patient's request

## 2016-11-11 NOTE — Patient Instructions (Signed)

## 2017-02-09 DIAGNOSIS — H524 Presbyopia: Secondary | ICD-10-CM | POA: Diagnosis not present

## 2017-02-09 DIAGNOSIS — E119 Type 2 diabetes mellitus without complications: Secondary | ICD-10-CM | POA: Diagnosis not present

## 2017-02-10 DIAGNOSIS — E1140 Type 2 diabetes mellitus with diabetic neuropathy, unspecified: Secondary | ICD-10-CM | POA: Diagnosis not present

## 2017-04-26 DIAGNOSIS — Z6832 Body mass index (BMI) 32.0-32.9, adult: Secondary | ICD-10-CM | POA: Diagnosis not present

## 2017-04-26 DIAGNOSIS — E038 Other specified hypothyroidism: Secondary | ICD-10-CM | POA: Diagnosis not present

## 2017-04-26 DIAGNOSIS — Z23 Encounter for immunization: Secondary | ICD-10-CM | POA: Diagnosis not present

## 2017-04-26 DIAGNOSIS — I10 Essential (primary) hypertension: Secondary | ICD-10-CM | POA: Diagnosis not present

## 2017-04-26 DIAGNOSIS — M5136 Other intervertebral disc degeneration, lumbar region: Secondary | ICD-10-CM | POA: Diagnosis not present

## 2017-04-26 DIAGNOSIS — E1140 Type 2 diabetes mellitus with diabetic neuropathy, unspecified: Secondary | ICD-10-CM | POA: Diagnosis not present

## 2017-04-28 ENCOUNTER — Other Ambulatory Visit: Payer: Self-pay | Admitting: Internal Medicine

## 2017-04-28 DIAGNOSIS — M5136 Other intervertebral disc degeneration, lumbar region: Secondary | ICD-10-CM

## 2017-05-08 ENCOUNTER — Other Ambulatory Visit: Payer: Medicare Other

## 2017-05-10 ENCOUNTER — Ambulatory Visit
Admission: RE | Admit: 2017-05-10 | Discharge: 2017-05-10 | Disposition: A | Discharge status: Home / Self Care | Payer: Medicare Other | Source: Ambulatory Visit | Attending: Internal Medicine | Admitting: Internal Medicine

## 2017-05-10 DIAGNOSIS — M5126 Other intervertebral disc displacement, lumbar region: Secondary | ICD-10-CM | POA: Diagnosis not present

## 2017-05-10 DIAGNOSIS — M5136 Other intervertebral disc degeneration, lumbar region: Secondary | ICD-10-CM

## 2017-05-19 ENCOUNTER — Other Ambulatory Visit: Payer: Self-pay | Admitting: Internal Medicine

## 2017-05-19 DIAGNOSIS — M47896 Other spondylosis, lumbar region: Secondary | ICD-10-CM

## 2017-05-26 ENCOUNTER — Other Ambulatory Visit: Payer: Medicare Other

## 2017-05-28 ENCOUNTER — Ambulatory Visit
Admission: RE | Admit: 2017-05-28 | Discharge: 2017-05-28 | Disposition: A | Discharge status: Home / Self Care | Payer: Medicare Other | Source: Ambulatory Visit | Attending: Internal Medicine | Admitting: Internal Medicine

## 2017-05-28 ENCOUNTER — Other Ambulatory Visit: Payer: Self-pay | Admitting: Internal Medicine

## 2017-05-28 DIAGNOSIS — M47896 Other spondylosis, lumbar region: Secondary | ICD-10-CM

## 2017-05-28 DIAGNOSIS — M545 Low back pain: Secondary | ICD-10-CM | POA: Diagnosis not present

## 2017-05-28 MED ORDER — METHYLPREDNISOLONE ACETATE 40 MG/ML INJ SUSP (RADIOLOG
120.0000 mg | Freq: Once | INTRAMUSCULAR | Status: AC
  Administered 2017-05-28: 120 mg via EPIDURAL

## 2017-05-28 MED ORDER — IOPAMIDOL (ISOVUE-M 200) INJECTION 41%
1.0000 mL | Freq: Once | INTRAMUSCULAR | Status: AC
  Administered 2017-05-28: 1 mL via EPIDURAL

## 2017-05-28 NOTE — Discharge Instructions (Signed)

## 2017-08-27 DIAGNOSIS — E1140 Type 2 diabetes mellitus with diabetic neuropathy, unspecified: Secondary | ICD-10-CM | POA: Diagnosis not present

## 2017-08-27 DIAGNOSIS — M47896 Other spondylosis, lumbar region: Secondary | ICD-10-CM | POA: Diagnosis not present

## 2017-08-27 DIAGNOSIS — E038 Other specified hypothyroidism: Secondary | ICD-10-CM | POA: Diagnosis not present

## 2017-08-27 DIAGNOSIS — I10 Essential (primary) hypertension: Secondary | ICD-10-CM | POA: Diagnosis not present

## 2017-08-27 DIAGNOSIS — Z6832 Body mass index (BMI) 32.0-32.9, adult: Secondary | ICD-10-CM | POA: Diagnosis not present

## 2017-09-01 DIAGNOSIS — Z01419 Encounter for gynecological examination (general) (routine) without abnormal findings: Secondary | ICD-10-CM | POA: Diagnosis not present

## 2017-09-15 DIAGNOSIS — Z1231 Encounter for screening mammogram for malignant neoplasm of breast: Secondary | ICD-10-CM | POA: Diagnosis not present

## 2017-09-15 DIAGNOSIS — Z803 Family history of malignant neoplasm of breast: Secondary | ICD-10-CM | POA: Diagnosis not present

## 2017-09-20 DIAGNOSIS — M5136 Other intervertebral disc degeneration, lumbar region: Secondary | ICD-10-CM | POA: Diagnosis not present

## 2017-09-20 DIAGNOSIS — M25562 Pain in left knee: Secondary | ICD-10-CM | POA: Diagnosis not present

## 2017-09-20 DIAGNOSIS — M48061 Spinal stenosis, lumbar region without neurogenic claudication: Secondary | ICD-10-CM | POA: Diagnosis not present

## 2017-10-12 DIAGNOSIS — K219 Gastro-esophageal reflux disease without esophagitis: Secondary | ICD-10-CM | POA: Diagnosis not present

## 2017-10-12 DIAGNOSIS — Z8719 Personal history of other diseases of the digestive system: Secondary | ICD-10-CM | POA: Diagnosis not present

## 2017-10-12 DIAGNOSIS — R1084 Generalized abdominal pain: Secondary | ICD-10-CM | POA: Diagnosis not present

## 2017-10-21 DIAGNOSIS — M5416 Radiculopathy, lumbar region: Secondary | ICD-10-CM | POA: Diagnosis not present

## 2017-11-12 DIAGNOSIS — M545 Low back pain: Secondary | ICD-10-CM | POA: Diagnosis not present

## 2017-11-12 DIAGNOSIS — M5416 Radiculopathy, lumbar region: Secondary | ICD-10-CM | POA: Diagnosis not present

## 2017-11-12 DIAGNOSIS — M25552 Pain in left hip: Secondary | ICD-10-CM | POA: Diagnosis not present

## 2017-11-20 DIAGNOSIS — M25552 Pain in left hip: Secondary | ICD-10-CM | POA: Diagnosis not present

## 2017-11-26 DIAGNOSIS — M545 Low back pain: Secondary | ICD-10-CM | POA: Diagnosis not present

## 2017-11-26 DIAGNOSIS — M5416 Radiculopathy, lumbar region: Secondary | ICD-10-CM | POA: Diagnosis not present

## 2017-11-26 DIAGNOSIS — M25559 Pain in unspecified hip: Secondary | ICD-10-CM | POA: Diagnosis not present

## 2017-11-26 DIAGNOSIS — M1712 Unilateral primary osteoarthritis, left knee: Secondary | ICD-10-CM | POA: Diagnosis not present

## 2017-12-02 DIAGNOSIS — M1612 Unilateral primary osteoarthritis, left hip: Secondary | ICD-10-CM | POA: Diagnosis not present

## 2017-12-13 DIAGNOSIS — M1712 Unilateral primary osteoarthritis, left knee: Secondary | ICD-10-CM | POA: Diagnosis not present

## 2017-12-14 DIAGNOSIS — M1612 Unilateral primary osteoarthritis, left hip: Secondary | ICD-10-CM | POA: Diagnosis not present

## 2017-12-14 DIAGNOSIS — M25562 Pain in left knee: Secondary | ICD-10-CM | POA: Diagnosis not present

## 2017-12-15 DIAGNOSIS — M1712 Unilateral primary osteoarthritis, left knee: Secondary | ICD-10-CM | POA: Diagnosis not present

## 2017-12-18 DIAGNOSIS — M1612 Unilateral primary osteoarthritis, left hip: Secondary | ICD-10-CM | POA: Diagnosis not present

## 2017-12-20 DIAGNOSIS — D538 Other specified nutritional anemias: Secondary | ICD-10-CM | POA: Diagnosis not present

## 2017-12-20 DIAGNOSIS — R82998 Other abnormal findings in urine: Secondary | ICD-10-CM | POA: Diagnosis not present

## 2017-12-20 DIAGNOSIS — E038 Other specified hypothyroidism: Secondary | ICD-10-CM | POA: Diagnosis not present

## 2017-12-20 DIAGNOSIS — D539 Nutritional anemia, unspecified: Secondary | ICD-10-CM | POA: Diagnosis not present

## 2017-12-20 DIAGNOSIS — E7849 Other hyperlipidemia: Secondary | ICD-10-CM | POA: Diagnosis not present

## 2017-12-20 DIAGNOSIS — E1140 Type 2 diabetes mellitus with diabetic neuropathy, unspecified: Secondary | ICD-10-CM | POA: Diagnosis not present

## 2017-12-21 DIAGNOSIS — M1712 Unilateral primary osteoarthritis, left knee: Secondary | ICD-10-CM | POA: Diagnosis not present

## 2017-12-23 DIAGNOSIS — M1712 Unilateral primary osteoarthritis, left knee: Secondary | ICD-10-CM | POA: Diagnosis not present

## 2017-12-27 DIAGNOSIS — R1084 Generalized abdominal pain: Secondary | ICD-10-CM | POA: Diagnosis not present

## 2017-12-27 DIAGNOSIS — Z1389 Encounter for screening for other disorder: Secondary | ICD-10-CM | POA: Diagnosis not present

## 2017-12-27 DIAGNOSIS — Z6832 Body mass index (BMI) 32.0-32.9, adult: Secondary | ICD-10-CM | POA: Diagnosis not present

## 2017-12-27 DIAGNOSIS — Z23 Encounter for immunization: Secondary | ICD-10-CM | POA: Diagnosis not present

## 2017-12-27 DIAGNOSIS — M47896 Other spondylosis, lumbar region: Secondary | ICD-10-CM | POA: Diagnosis not present

## 2017-12-27 DIAGNOSIS — M5136 Other intervertebral disc degeneration, lumbar region: Secondary | ICD-10-CM | POA: Diagnosis not present

## 2017-12-27 DIAGNOSIS — M25551 Pain in right hip: Secondary | ICD-10-CM | POA: Diagnosis not present

## 2017-12-27 DIAGNOSIS — E1169 Type 2 diabetes mellitus with other specified complication: Secondary | ICD-10-CM | POA: Diagnosis not present

## 2017-12-27 DIAGNOSIS — E1140 Type 2 diabetes mellitus with diabetic neuropathy, unspecified: Secondary | ICD-10-CM | POA: Diagnosis not present

## 2017-12-27 DIAGNOSIS — G608 Other hereditary and idiopathic neuropathies: Secondary | ICD-10-CM | POA: Diagnosis not present

## 2017-12-27 DIAGNOSIS — M542 Cervicalgia: Secondary | ICD-10-CM | POA: Diagnosis not present

## 2017-12-27 DIAGNOSIS — D538 Other specified nutritional anemias: Secondary | ICD-10-CM | POA: Diagnosis not present

## 2017-12-27 DIAGNOSIS — Z0000 Encounter for general adult medical examination without abnormal findings: Secondary | ICD-10-CM | POA: Diagnosis not present

## 2017-12-29 DIAGNOSIS — M1712 Unilateral primary osteoarthritis, left knee: Secondary | ICD-10-CM | POA: Diagnosis not present

## 2018-01-04 DIAGNOSIS — M1711 Unilateral primary osteoarthritis, right knee: Secondary | ICD-10-CM | POA: Diagnosis not present

## 2018-01-04 DIAGNOSIS — M1712 Unilateral primary osteoarthritis, left knee: Secondary | ICD-10-CM | POA: Diagnosis not present

## 2018-01-04 DIAGNOSIS — M1612 Unilateral primary osteoarthritis, left hip: Secondary | ICD-10-CM | POA: Diagnosis not present

## 2018-01-06 DIAGNOSIS — M1712 Unilateral primary osteoarthritis, left knee: Secondary | ICD-10-CM | POA: Diagnosis not present

## 2018-01-11 DIAGNOSIS — M1712 Unilateral primary osteoarthritis, left knee: Secondary | ICD-10-CM | POA: Diagnosis not present

## 2018-01-11 DIAGNOSIS — M25562 Pain in left knee: Secondary | ICD-10-CM | POA: Diagnosis not present

## 2018-01-18 DIAGNOSIS — M1712 Unilateral primary osteoarthritis, left knee: Secondary | ICD-10-CM | POA: Diagnosis not present

## 2018-01-25 DIAGNOSIS — M1712 Unilateral primary osteoarthritis, left knee: Secondary | ICD-10-CM | POA: Diagnosis not present

## 2018-02-09 DIAGNOSIS — E119 Type 2 diabetes mellitus without complications: Secondary | ICD-10-CM | POA: Diagnosis not present

## 2018-02-09 DIAGNOSIS — H524 Presbyopia: Secondary | ICD-10-CM | POA: Diagnosis not present

## 2018-02-25 DIAGNOSIS — M542 Cervicalgia: Secondary | ICD-10-CM | POA: Diagnosis not present

## 2018-02-25 DIAGNOSIS — E1169 Type 2 diabetes mellitus with other specified complication: Secondary | ICD-10-CM | POA: Diagnosis not present

## 2018-02-25 DIAGNOSIS — Z6833 Body mass index (BMI) 33.0-33.9, adult: Secondary | ICD-10-CM | POA: Diagnosis not present

## 2018-03-08 DIAGNOSIS — M5412 Radiculopathy, cervical region: Secondary | ICD-10-CM | POA: Diagnosis not present

## 2018-03-08 DIAGNOSIS — M1712 Unilateral primary osteoarthritis, left knee: Secondary | ICD-10-CM | POA: Diagnosis not present

## 2018-03-08 DIAGNOSIS — M542 Cervicalgia: Secondary | ICD-10-CM | POA: Diagnosis not present

## 2018-03-17 DIAGNOSIS — M542 Cervicalgia: Secondary | ICD-10-CM | POA: Diagnosis not present

## 2018-03-22 DIAGNOSIS — M542 Cervicalgia: Secondary | ICD-10-CM | POA: Diagnosis not present

## 2018-04-13 DIAGNOSIS — M5030 Other cervical disc degeneration, unspecified cervical region: Secondary | ICD-10-CM | POA: Insufficient documentation

## 2018-05-03 DIAGNOSIS — M5030 Other cervical disc degeneration, unspecified cervical region: Secondary | ICD-10-CM | POA: Diagnosis not present

## 2018-05-16 DIAGNOSIS — M5412 Radiculopathy, cervical region: Secondary | ICD-10-CM | POA: Diagnosis not present

## 2018-05-16 DIAGNOSIS — M5030 Other cervical disc degeneration, unspecified cervical region: Secondary | ICD-10-CM | POA: Diagnosis not present

## 2018-05-19 DIAGNOSIS — Z6833 Body mass index (BMI) 33.0-33.9, adult: Secondary | ICD-10-CM | POA: Diagnosis not present

## 2018-05-19 DIAGNOSIS — G608 Other hereditary and idiopathic neuropathies: Secondary | ICD-10-CM | POA: Diagnosis not present

## 2018-05-19 DIAGNOSIS — E038 Other specified hypothyroidism: Secondary | ICD-10-CM | POA: Diagnosis not present

## 2018-05-19 DIAGNOSIS — I10 Essential (primary) hypertension: Secondary | ICD-10-CM | POA: Diagnosis not present

## 2018-05-19 DIAGNOSIS — M5136 Other intervertebral disc degeneration, lumbar region: Secondary | ICD-10-CM | POA: Diagnosis not present

## 2018-05-19 DIAGNOSIS — E1169 Type 2 diabetes mellitus with other specified complication: Secondary | ICD-10-CM | POA: Diagnosis not present

## 2018-05-19 DIAGNOSIS — M542 Cervicalgia: Secondary | ICD-10-CM | POA: Diagnosis not present

## 2018-06-03 DIAGNOSIS — M5412 Radiculopathy, cervical region: Secondary | ICD-10-CM | POA: Diagnosis not present

## 2018-08-20 DIAGNOSIS — M5412 Radiculopathy, cervical region: Secondary | ICD-10-CM | POA: Diagnosis not present

## 2018-08-20 DIAGNOSIS — Z79891 Long term (current) use of opiate analgesic: Secondary | ICD-10-CM | POA: Diagnosis not present

## 2018-08-20 DIAGNOSIS — M5030 Other cervical disc degeneration, unspecified cervical region: Secondary | ICD-10-CM | POA: Diagnosis not present

## 2018-08-20 DIAGNOSIS — M545 Low back pain: Secondary | ICD-10-CM | POA: Diagnosis not present

## 2018-08-20 DIAGNOSIS — M542 Cervicalgia: Secondary | ICD-10-CM | POA: Diagnosis not present

## 2018-09-20 DIAGNOSIS — G2581 Restless legs syndrome: Secondary | ICD-10-CM | POA: Diagnosis not present

## 2018-09-20 DIAGNOSIS — E1140 Type 2 diabetes mellitus with diabetic neuropathy, unspecified: Secondary | ICD-10-CM | POA: Diagnosis not present

## 2018-09-20 DIAGNOSIS — E039 Hypothyroidism, unspecified: Secondary | ICD-10-CM | POA: Diagnosis not present

## 2018-09-20 DIAGNOSIS — E1169 Type 2 diabetes mellitus with other specified complication: Secondary | ICD-10-CM | POA: Diagnosis not present

## 2018-09-20 DIAGNOSIS — G609 Hereditary and idiopathic neuropathy, unspecified: Secondary | ICD-10-CM | POA: Diagnosis not present

## 2018-09-20 DIAGNOSIS — M47896 Other spondylosis, lumbar region: Secondary | ICD-10-CM | POA: Diagnosis not present

## 2018-09-20 DIAGNOSIS — I10 Essential (primary) hypertension: Secondary | ICD-10-CM | POA: Diagnosis not present

## 2018-09-21 DIAGNOSIS — E1169 Type 2 diabetes mellitus with other specified complication: Secondary | ICD-10-CM | POA: Diagnosis not present

## 2018-10-04 DIAGNOSIS — G609 Hereditary and idiopathic neuropathy, unspecified: Secondary | ICD-10-CM | POA: Diagnosis not present

## 2018-10-04 DIAGNOSIS — M47896 Other spondylosis, lumbar region: Secondary | ICD-10-CM | POA: Diagnosis not present

## 2018-10-04 DIAGNOSIS — E1169 Type 2 diabetes mellitus with other specified complication: Secondary | ICD-10-CM | POA: Diagnosis not present

## 2018-10-04 DIAGNOSIS — M2540 Effusion, unspecified joint: Secondary | ICD-10-CM | POA: Diagnosis not present

## 2018-10-04 DIAGNOSIS — M542 Cervicalgia: Secondary | ICD-10-CM | POA: Diagnosis not present

## 2018-10-04 DIAGNOSIS — M25562 Pain in left knee: Secondary | ICD-10-CM | POA: Diagnosis not present

## 2018-10-04 DIAGNOSIS — M79673 Pain in unspecified foot: Secondary | ICD-10-CM | POA: Diagnosis not present

## 2018-10-06 DIAGNOSIS — M1712 Unilateral primary osteoarthritis, left knee: Secondary | ICD-10-CM | POA: Diagnosis not present

## 2018-10-18 DIAGNOSIS — M79602 Pain in left arm: Secondary | ICD-10-CM | POA: Diagnosis not present

## 2018-11-01 ENCOUNTER — Encounter: Payer: Self-pay | Admitting: Gastroenterology

## 2018-11-18 DIAGNOSIS — M1712 Unilateral primary osteoarthritis, left knee: Secondary | ICD-10-CM | POA: Diagnosis not present

## 2018-11-22 DIAGNOSIS — Z803 Family history of malignant neoplasm of breast: Secondary | ICD-10-CM | POA: Diagnosis not present

## 2018-11-22 DIAGNOSIS — Z1231 Encounter for screening mammogram for malignant neoplasm of breast: Secondary | ICD-10-CM | POA: Diagnosis not present

## 2018-11-23 ENCOUNTER — Encounter: Payer: Self-pay | Admitting: Gastroenterology

## 2018-11-24 DIAGNOSIS — M1712 Unilateral primary osteoarthritis, left knee: Secondary | ICD-10-CM | POA: Diagnosis not present

## 2018-11-30 DIAGNOSIS — M1712 Unilateral primary osteoarthritis, left knee: Secondary | ICD-10-CM | POA: Diagnosis not present

## 2018-12-07 ENCOUNTER — Ambulatory Visit: Payer: Medicare Other | Admitting: *Deleted

## 2018-12-07 ENCOUNTER — Other Ambulatory Visit: Payer: Self-pay

## 2018-12-07 VITALS — Ht 61.5 in | Wt 175.0 lb

## 2018-12-07 DIAGNOSIS — Z86010 Personal history of colonic polyps: Secondary | ICD-10-CM

## 2018-12-07 MED ORDER — SUPREP BOWEL PREP KIT 17.5-3.13-1.6 GM/177ML PO SOLN: 1.0000 | Freq: Once | ORAL | 0 refills | Status: AC

## 2018-12-07 NOTE — Progress Notes (Signed)
No egg or soy allergy known to patient  No issues with past sedation with any surgeries  or procedures, no intubation problems  No diet pills per patient No home 02 use per patient  No blood thinners per patient  Pt denies issues with constipation  No A fib or A flutter  EMMI video sent to pt's e mail   Pt will bring her 2020 insurance cards to 3rd floor tomorrow to be scanned in the system- she is aware she needs to wear a mask   Pt verified name, DOB, address and insurance during PV today. Pt to pick up instruction packet to included paper to complete and mail back to [facility] with addressed and stamped envelope, Emmi video, copy of consent form to read and not return, and instructions. PV completed over the phone. Pt encouraged to call with questions or issues    Pt is aware that care partner will wait in the car during proceudre; if they feel like they will be too hot to wait in the car; they may wait in the lobby.  We want them to wear a mask (we do not have any that we can provide them), practice social distancing, and we will check their temperatures when they get here.  I did remind patient that their care partner needs to stay in the parking lot the entire time. Pt will wear mask into building.

## 2018-12-12 DIAGNOSIS — M1712 Unilateral primary osteoarthritis, left knee: Secondary | ICD-10-CM | POA: Diagnosis not present

## 2018-12-15 DIAGNOSIS — M1712 Unilateral primary osteoarthritis, left knee: Secondary | ICD-10-CM | POA: Diagnosis not present

## 2018-12-19 DIAGNOSIS — M1712 Unilateral primary osteoarthritis, left knee: Secondary | ICD-10-CM | POA: Diagnosis not present

## 2018-12-20 ENCOUNTER — Telehealth: Payer: Self-pay | Admitting: Gastroenterology

## 2018-12-20 NOTE — Telephone Encounter (Signed)

## 2018-12-20 NOTE — Telephone Encounter (Signed)
Covid-19 Screening Questions:  Do you now or have you had a fever in the last 14 days? no  Do you have any respiratory symptoms of shortness of breath or cough now or in the last 14 days? no  Do you have any family members or close contacts with diagnosed or suspected Covid-19 in the past 14 days? no  Have you been tested for Covid-19 and found to be positive? no  Pt made aware of that care partner may wait in the car or come up to the lobby during the procedure but will need to provide their own mask.

## 2018-12-21 ENCOUNTER — Other Ambulatory Visit: Payer: Self-pay

## 2018-12-21 ENCOUNTER — Encounter: Payer: Self-pay | Admitting: Gastroenterology

## 2018-12-21 ENCOUNTER — Ambulatory Visit (AMBULATORY_SURGERY_CENTER): Payer: Medicare Other | Admitting: Gastroenterology

## 2018-12-21 VITALS — BP 76/28 | HR 56 | Temp 98.2°F | Resp 15 | Ht 61.0 in | Wt 175.0 lb

## 2018-12-21 DIAGNOSIS — K640 First degree hemorrhoids: Secondary | ICD-10-CM

## 2018-12-21 DIAGNOSIS — Z86010 Personal history of colonic polyps: Secondary | ICD-10-CM

## 2018-12-21 DIAGNOSIS — Z1211 Encounter for screening for malignant neoplasm of colon: Secondary | ICD-10-CM | POA: Diagnosis not present

## 2018-12-21 DIAGNOSIS — D123 Benign neoplasm of transverse colon: Secondary | ICD-10-CM

## 2018-12-21 DIAGNOSIS — K635 Polyp of colon: Secondary | ICD-10-CM | POA: Diagnosis not present

## 2018-12-21 DIAGNOSIS — D125 Benign neoplasm of sigmoid colon: Secondary | ICD-10-CM

## 2018-12-21 DIAGNOSIS — D122 Benign neoplasm of ascending colon: Secondary | ICD-10-CM

## 2018-12-21 MED ORDER — SODIUM CHLORIDE 0.9 % IV SOLN: 500.0000 mL | Freq: Once | INTRAVENOUS | Status: DC

## 2018-12-21 NOTE — Patient Instructions (Signed)
YOU HAD AN ENDOSCOPIC PROCEDURE TODAY AT THE [facility]:   Refer to the procedure report that was given to you for any specific questions about what was found during the examination.  If the procedure report does not answer your questions, please call your gastroenterologist to clarify.  If you requested that your care partner not be given the details of your procedure findings, then the procedure report has been included in a sealed envelope for you to review at your convenience later.  YOU SHOULD EXPECT: Some feelings of bloating in the abdomen. Passage of more gas than usual.  Walking can help get rid of the air that was put into your GI tract during the procedure and reduce the bloating. If you had a lower endoscopy (such as a colonoscopy or flexible sigmoidoscopy) you may notice spotting of blood in your stool or on the toilet paper. If you underwent a bowel prep for your procedure, you may not have a normal bowel movement for a few days.  Please Note:  You might notice some irritation and congestion in your nose or some drainage.  This is from the oxygen used during your procedure.  There is no need for concern and it should clear up in a day or so.  SYMPTOMS TO REPORT IMMEDIATELY:   Following lower endoscopy (colonoscopy or flexible sigmoidoscopy):  Excessive amounts of blood in the stool  Significant tenderness or worsening of abdominal pains  Swelling of the abdomen that is new, acute  Fever of 100F or higher  For urgent or emergent issues, a gastroenterologist can be reached at any hour by calling [phone].   DIET:  We do recommend a small meal at first, but then you may proceed to your regular diet.  Drink plenty of fluids but you should avoid alcoholic beverages for 24 hours.  ACTIVITY:  You should plan to take it easy for the rest of today and you should NOT DRIVE or use heavy machinery until tomorrow (because of the sedation medicines used during the test).     FOLLOW UP: Our staff will call the number listed on your records 48-72 hours following your procedure to check on you and address any questions or concerns that you may have regarding the information given to you following your procedure. If we do not reach you, we will leave a message.  We will attempt to reach you two times.  During this call, we will ask if you have developed any symptoms of COVID 19. If you develop any symptoms (ie: fever, flu-like symptoms, shortness of breath, cough etc.) before then, please call [phone].  If you test positive for Covid 19 in the 2 weeks post procedure, please call and report this information to us.    If any biopsies were taken you will be contacted by phone or by letter within the next 1-3 weeks.  Please call us at [phone] if you have not heard about the biopsies in 3 weeks.    SIGNATURES/CONFIDENTIALITY: You and/or your care partner have signed paperwork which will be entered into your electronic medical record.  These signatures attest to the fact that that the information above on your After Visit Summary has been reviewed and is understood.  Full responsibility of the confidentiality of this discharge information lies with you and/or your care-partner. 

## 2018-12-21 NOTE — Op Note (Signed)
[place] Patient Name: [name] [name] Procedure Date: 12/21/2018 8:36 AM MRN: [id number] Endoscopist: Gerrit Heck , MD Age: 73 Referring MD:  Date of Birth: [birth year] Gender: Female Account #: [id number] Procedure:                Colonoscopy Indications:              High risk colon cancer surveillance: Personal                            history of sessile serrated colon polyp (less than                            10 mm in size) with no dysplasia. Last colonoscopy                            was in 2015 and notable for subcentimeter SSP and 2                            subcentimeter HPs. Otherwise, no active GI sxs. Medicines:                Monitored Anesthesia Care Procedure:                Pre-Anesthesia Assessment:                           - Prior to the procedure, a History and Physical                            was performed, and patient medications and                            allergies were reviewed. The patient's tolerance of                            previous anesthesia was also reviewed. The risks                            and benefits of the procedure and the sedation                            options and risks were discussed with the patient.                            All questions were answered, and informed consent                            was obtained. Prior Anticoagulants: The patient has                            taken no previous anticoagulant or antiplatelet                            agents except for aspirin. ASA Grade Assessment: II                            -  A patient with mild systemic disease. After                            reviewing the risks and benefits, the patient was                            deemed in satisfactory condition to undergo the                            procedure.                           After obtaining informed consent, the colonoscope                            was passed under direct vision.  Throughout the                            procedure, the patient's blood pressure, pulse, and                            oxygen saturations were monitored continuously. The                            Model CF-HQ190L [id number]) scope was introduced                            through the anus and advanced to the the cecum,                            identified by appendiceal orifice and ileocecal                            valve. The colonoscopy was performed without                            difficulty. The patient tolerated the procedure                            well. The quality of the bowel preparation was                            adequate. The ileocecal valve, appendiceal orifice,                            and rectum were photographed. Scope In: 8:54:31 AM Scope Out: 9:22:24 AM Scope Withdrawal Time: 0 hours 17 minutes 57 seconds  Total Procedure Duration: 0 hours 27 minutes 53 seconds  Findings:                 The perianal and digital rectal examinations were                            normal.  Two sessile polyps were found in the transverse                            colon and ascending colon. The polyps were 2 to 5                            mm in size. These polyps were removed with a cold                            snare (x1) and cold forceps (x1). Resection and                            retrieval were complete. Estimated blood loss was                            minimal.                           Three sessile polyps were found in the sigmoid                            colon. The polyps were 3 to 11 mm in size. These                            polyps were removed with a cold snare. Resection                            and retrieval were complete. Estimated blood loss                            was minimal.                           Non-bleeding internal hemorrhoids were found during                            retroflexion. The hemorrhoids were  small.                           The ascending colon revealed moderately excessive                            looping. Advancing the scope required applying                            abdominal pressure. Complications:            No immediate complications. Estimated Blood Loss:     Estimated blood loss was minimal. Impression:               - Two 2 to 5 mm polyps in the transverse colon and                            in the ascending colon, removed with a cold snare.  Resected and retrieved.                           - Three 3 to 11 mm polyps in the sigmoid colon,                            removed with a cold snare. Resected and retrieved.                           - Non-bleeding internal hemorrhoids.                           - There was significant looping of the colon. Recommendation:           - Patient has a contact number available for                            emergencies. The signs and symptoms of potential                            delayed complications were discussed with the                            patient. Return to normal activities tomorrow.                            Written discharge instructions were provided to the                            patient.                           - Resume previous diet.                           - Continue present medications.                           - Await pathology results.                           - Repeat colonoscopy in 3 - 5 years for                            surveillance based on pathology results.                           - Return to GI clinic PRN.                           - Use fiber, for example Citrucel, Silvana, Konsyl                            or Metamucil.                           - Internal  hemorrhoids were noted on this study and                            may be amenable to hemorrhoid band ligation. If you                            are interested in further treatment of these                             hemorrhoids with band ligation, please contact my                            clinic to set up an appointment for evaluation and                            treatment. Gerrit Heck, MD 12/21/2018 9:32:59 AM

## 2018-12-21 NOTE — Progress Notes (Signed)
Temp per [facility]

## 2018-12-21 NOTE — Progress Notes (Signed)
To PACU, VSS. Report to [email or web address] 

## 2018-12-21 NOTE — Progress Notes (Signed)
Called to room to assist during endoscopic procedure.  Patient ID and intended procedure confirmed with present staff. Received instructions for my participation in the procedure from the performing physician.  

## 2018-12-22 DIAGNOSIS — M1712 Unilateral primary osteoarthritis, left knee: Secondary | ICD-10-CM | POA: Diagnosis not present

## 2018-12-23 ENCOUNTER — Telehealth: Payer: Self-pay | Admitting: *Deleted

## 2018-12-23 ENCOUNTER — Encounter: Payer: Self-pay | Admitting: Gastroenterology

## 2018-12-23 NOTE — Telephone Encounter (Signed)
  Follow up Call-  Call back number 12/21/2018  Post procedure Call Back phone  # [phone]  Permission to leave phone message Yes  Some recent data might be hidden     Patient questions:  Do you have a fever, pain , or abdominal swelling? No. Pain Score  0 *  Have you tolerated food without any problems? Yes.    Have you been able to return to your normal activities? Yes.    Do you have any questions about your discharge instructions: Diet   No. Medications  No. Follow up visit  No.  Do you have questions or concerns about your Care? No.  Actions: * If pain score is 4 or above: No action needed, pain <4.  1. Have you developed a fever since your procedure? NO  2.   Have you had an respiratory symptoms (SOB or cough) since your procedure? NO  3.   Have you tested positive for COVID 19 since your procedure NO  4.   Have you had any family members/close contacts diagnosed with the COVID 19 since your procedure?  NO   If yes to any of these questions please route to Joylene John, RN and Alphonsa Gin, RN.

## 2018-12-26 DIAGNOSIS — M1712 Unilateral primary osteoarthritis, left knee: Secondary | ICD-10-CM | POA: Diagnosis not present

## 2018-12-30 DIAGNOSIS — M1712 Unilateral primary osteoarthritis, left knee: Secondary | ICD-10-CM | POA: Diagnosis not present

## 2019-01-19 DIAGNOSIS — E7849 Other hyperlipidemia: Secondary | ICD-10-CM | POA: Diagnosis not present

## 2019-01-19 DIAGNOSIS — E038 Other specified hypothyroidism: Secondary | ICD-10-CM | POA: Diagnosis not present

## 2019-01-19 DIAGNOSIS — E1169 Type 2 diabetes mellitus with other specified complication: Secondary | ICD-10-CM | POA: Diagnosis not present

## 2019-01-19 DIAGNOSIS — D539 Nutritional anemia, unspecified: Secondary | ICD-10-CM | POA: Diagnosis not present

## 2019-01-26 DIAGNOSIS — M25562 Pain in left knee: Secondary | ICD-10-CM | POA: Diagnosis not present

## 2019-01-26 DIAGNOSIS — N951 Menopausal and female climacteric states: Secondary | ICD-10-CM | POA: Diagnosis not present

## 2019-01-26 DIAGNOSIS — Z1331 Encounter for screening for depression: Secondary | ICD-10-CM | POA: Diagnosis not present

## 2019-01-26 DIAGNOSIS — G609 Hereditary and idiopathic neuropathy, unspecified: Secondary | ICD-10-CM | POA: Diagnosis not present

## 2019-01-26 DIAGNOSIS — M47896 Other spondylosis, lumbar region: Secondary | ICD-10-CM | POA: Diagnosis not present

## 2019-01-26 DIAGNOSIS — Z0000 Encounter for general adult medical examination without abnormal findings: Secondary | ICD-10-CM | POA: Diagnosis not present

## 2019-01-26 DIAGNOSIS — I10 Essential (primary) hypertension: Secondary | ICD-10-CM | POA: Diagnosis not present

## 2019-01-26 DIAGNOSIS — E785 Hyperlipidemia, unspecified: Secondary | ICD-10-CM | POA: Diagnosis not present

## 2019-01-26 DIAGNOSIS — K219 Gastro-esophageal reflux disease without esophagitis: Secondary | ICD-10-CM | POA: Diagnosis not present

## 2019-01-26 DIAGNOSIS — E1169 Type 2 diabetes mellitus with other specified complication: Secondary | ICD-10-CM | POA: Diagnosis not present

## 2019-01-26 DIAGNOSIS — E1140 Type 2 diabetes mellitus with diabetic neuropathy, unspecified: Secondary | ICD-10-CM | POA: Diagnosis not present

## 2019-01-26 DIAGNOSIS — M79606 Pain in leg, unspecified: Secondary | ICD-10-CM | POA: Diagnosis not present

## 2019-01-26 DIAGNOSIS — M79673 Pain in unspecified foot: Secondary | ICD-10-CM | POA: Diagnosis not present

## 2019-01-26 DIAGNOSIS — M25551 Pain in right hip: Secondary | ICD-10-CM | POA: Diagnosis not present

## 2019-01-26 DIAGNOSIS — R82998 Other abnormal findings in urine: Secondary | ICD-10-CM | POA: Diagnosis not present

## 2019-02-09 DIAGNOSIS — Z23 Encounter for immunization: Secondary | ICD-10-CM | POA: Diagnosis not present

## 2019-02-14 DIAGNOSIS — H52203 Unspecified astigmatism, bilateral: Secondary | ICD-10-CM | POA: Diagnosis not present

## 2019-02-14 DIAGNOSIS — H5213 Myopia, bilateral: Secondary | ICD-10-CM | POA: Diagnosis not present

## 2019-02-14 DIAGNOSIS — H43813 Vitreous degeneration, bilateral: Secondary | ICD-10-CM | POA: Diagnosis not present

## 2019-02-14 DIAGNOSIS — E119 Type 2 diabetes mellitus without complications: Secondary | ICD-10-CM | POA: Diagnosis not present

## 2019-03-13 DIAGNOSIS — M1712 Unilateral primary osteoarthritis, left knee: Secondary | ICD-10-CM | POA: Diagnosis not present

## 2019-03-22 DIAGNOSIS — M5416 Radiculopathy, lumbar region: Secondary | ICD-10-CM | POA: Diagnosis not present

## 2019-03-22 DIAGNOSIS — M5030 Other cervical disc degeneration, unspecified cervical region: Secondary | ICD-10-CM | POA: Diagnosis not present

## 2019-03-22 DIAGNOSIS — M5412 Radiculopathy, cervical region: Secondary | ICD-10-CM | POA: Diagnosis not present

## 2019-04-13 DIAGNOSIS — M5416 Radiculopathy, lumbar region: Secondary | ICD-10-CM | POA: Diagnosis not present

## 2019-04-13 DIAGNOSIS — M5136 Other intervertebral disc degeneration, lumbar region: Secondary | ICD-10-CM | POA: Diagnosis not present

## 2019-04-28 DIAGNOSIS — K219 Gastro-esophageal reflux disease without esophagitis: Secondary | ICD-10-CM | POA: Diagnosis not present

## 2019-04-28 DIAGNOSIS — E1169 Type 2 diabetes mellitus with other specified complication: Secondary | ICD-10-CM | POA: Diagnosis not present

## 2019-04-28 DIAGNOSIS — M542 Cervicalgia: Secondary | ICD-10-CM | POA: Diagnosis not present

## 2019-04-28 DIAGNOSIS — G609 Hereditary and idiopathic neuropathy, unspecified: Secondary | ICD-10-CM | POA: Diagnosis not present

## 2019-04-28 DIAGNOSIS — G250 Essential tremor: Secondary | ICD-10-CM | POA: Diagnosis not present

## 2019-04-28 DIAGNOSIS — M47896 Other spondylosis, lumbar region: Secondary | ICD-10-CM | POA: Diagnosis not present

## 2019-04-28 DIAGNOSIS — M25562 Pain in left knee: Secondary | ICD-10-CM | POA: Diagnosis not present

## 2019-04-28 DIAGNOSIS — E785 Hyperlipidemia, unspecified: Secondary | ICD-10-CM | POA: Diagnosis not present

## 2019-04-28 DIAGNOSIS — E039 Hypothyroidism, unspecified: Secondary | ICD-10-CM | POA: Diagnosis not present

## 2019-04-28 DIAGNOSIS — I10 Essential (primary) hypertension: Secondary | ICD-10-CM | POA: Diagnosis not present

## 2019-05-16 DIAGNOSIS — M5412 Radiculopathy, cervical region: Secondary | ICD-10-CM | POA: Diagnosis not present

## 2019-10-27 ENCOUNTER — Other Ambulatory Visit: Payer: Self-pay

## 2019-10-27 ENCOUNTER — Encounter: Payer: Self-pay | Admitting: Cardiology

## 2019-10-27 ENCOUNTER — Ambulatory Visit: Payer: Medicare Other | Admitting: Cardiology

## 2019-10-27 VITALS — BP 150/72 | HR 60 | Temp 96.4°F | Ht 61.5 in | Wt 174.0 lb

## 2019-10-27 DIAGNOSIS — Z01818 Encounter for other preprocedural examination: Secondary | ICD-10-CM

## 2019-10-27 DIAGNOSIS — R600 Localized edema: Secondary | ICD-10-CM

## 2019-10-27 DIAGNOSIS — I10 Essential (primary) hypertension: Secondary | ICD-10-CM

## 2019-10-27 NOTE — Progress Notes (Signed)
Cardiology Office Note:    Date:  10/29/2019   ID:  [name] [name], [name] [name] 31-May-1946, MRN [id number]  PCP:  [name] Infante, MD  Cardiologist:  No primary care provider on file.  Electrophysiologist:  None   Referring MD: [name] Infante, MD   Chief Complaint  Patient presents with  . Pre-op Exam    History of Present Illness:    [name] [name] is a 74 y.o. female with a hx of hypothyroidism, GERD, type 2 diabetes, hypertension, obesity, pancreatitis who is referred by Dr. Joylene [name] for preop evaluation prior to knee surgery.  She reports that she walks for 45 minutes 3 times per week.  She denies any chest pain or dyspnea.  Exertion is primarily limited by her knee pain.  States that she can walk up a flight of stairs without stopping.  She does report she has been having lower extremity edema, primarily in the left leg.  Most recent hemoglobin A1c 7.3.  She takes benazepril 20 mg daily and atenolol 50 mg daily for hypertension.  Reports BP 110-130s at home.  No smoking history.  Family history includes father died during CABG in 17s, brother had CABG in 33s, mother had CABG in 53s.    Past Medical History:  Diagnosis Date  . Anxiety    occ  . DDD (degenerative disc disease)   . DJD (degenerative joint disease)   . DM type 2 (diabetes mellitus, type 2) ([place])   . GERD (gastroesophageal reflux disease)   . Hyperlipidemia   . Hypertension   . Hypothyroidism   . Pancreatitis    h/o, twice    Past Surgical History:  Procedure Laterality Date  . BREAST CYST EXCISION     left, benign   . COLONOSCOPY    . LUMBAR LAMINECTOMY N/A 04/21/2013   Procedure: CENTRAL MICRODISCECTOMY AND COMPLETE DECOMPRESSION OF LUMBAR LAMINECTOMY L4-L5 ;  Surgeon: Tobi Bastos, MD;  Location: [facility];  Service: Orthopedics;  Laterality: N/A;  . POLYPECTOMY    . [place]   right  . TOTAL ABDOMINAL HYSTERECTOMY  1984    Current Medications: Current Meds  Medication Sig  .  acetaminophen (TYLENOL) 325 MG tablet Take 650 mg by mouth every 6 (six) hours as needed for mild pain, moderate pain or headache.  Marland Kitchen amoxicillin (AMOXIL) 500 MG tablet Take 500 mg by mouth 3 (three) times daily.  Marland Kitchen aspirin 81 MG tablet Take 81 mg by mouth daily.  Marland Kitchen atenolol (TENORMIN) 50 MG tablet Take 50 mg by mouth daily.  Marland Kitchen atorvastatin (LIPITOR) 10 MG tablet Take 10 mg by mouth daily.  . benazepril (LOTENSIN) 20 MG tablet Take 20 mg by mouth daily.  . Cyanocobalamin (VITAMIN B 12 PO) Take by mouth at bedtime.  Marland Kitchen estradiol (ESTRACE) 0.5 MG tablet Take 1 tablet by mouth every other day.   Marland Kitchen FARXIGA 10 MG TABS tablet Take 10 mg by mouth daily.  . furosemide (LASIX) 20 MG tablet TAKE 1 TABLET BY MOUTH AS NEEDED FOR SWELLING  TAKE NO MORE THAN 3 4 DAYS PER WEEK  . glucose blood test strip USE TO TEST BLOOD SUGAR ONCE DAILY  . ibuprofen (ADVIL) 800 MG tablet Take 800 mg by mouth every 6 (six) hours as needed.  Marland Kitchen levothyroxine (SYNTHROID, LEVOTHROID) 88 MCG tablet Take 88 mcg by mouth daily before breakfast.  . metFORMIN (GLUCOPHAGE-XR) 500 MG 24 hr tablet Take 1 tablet by mouth as directed. 2 tablets in the morning  and 1 tablet at night  . Multiple Vitamin (MULTIVITAMIN) capsule Take 1 capsule by mouth every other day.   Marland Kitchen omeprazole (PRILOSEC) 40 MG capsule Take by mouth daily.  . potassium chloride (KLOR-CON) 10 MEQ tablet potassium chloride ER 10 mEq tablet,extended release  . traMADol (ULTRAM) 50 MG tablet Take by mouth every 6 (six) hours as needed for moderate pain or severe pain.     Allergies:   Codeine, Hydrocodone, Penicillins, and Sulfa antibiotics   Social History   Socioeconomic History  . Marital status: Widowed    Spouse name: Not on file  . Number of children: 1  . Years of education: 40  . Highest education level: Not on file  Occupational History  . Not on file  Tobacco Use  . Smoking status: Never Smoker  . Smokeless tobacco: Never Used  Substance and Sexual  Activity  . Alcohol use: No  . Drug use: No  . Sexual activity: Not on file  Other Topics Concern  . Not on file  Social History Narrative  . Not on file   Social Determinants of Health   Financial Resource Strain:   . Difficulty of Paying Living Expenses:   Food Insecurity:   . Worried About Charity fundraiser in the Last Year:   . Arboriculturist in the Last Year:   Transportation Needs:   . Film/video editor (Medical):   Marland Kitchen Lack of Transportation (Non-Medical):   Physical Activity:   . Days of Exercise per Week:   . Minutes of Exercise per Session:   Stress:   . Feeling of Stress :   Social Connections:   . Frequency of Communication with Friends and Family:   . Frequency of Social Gatherings with Friends and Family:   . Attends Religious Services:   . Active Member of Clubs or Organizations:   . Attends Archivist Meetings:   Marland Kitchen Marital Status:      Family History: The patient's family history includes Dementia in her mother; Fibromyalgia in her sister; Heart disease in her brother and father; Hypertension in her brother. There is no history of Colon cancer, Colon polyps, Esophageal cancer, Rectal cancer, or Stomach cancer.  ROS:   Please see the history of present illness.     All other systems reviewed and are negative.  EKGs/Labs/Other Studies Reviewed:    The following studies were reviewed today:   EKG:  EKG is ordered today.  The ekg ordered today demonstrates normal sinus rhythm, rate 60, borderline LVH, poor R wave progression  Recent Labs: No results found for requested labs within last 8760 hours.  Recent Lipid Panel    Component Value Date/Time   CHOL  11/24/2006 2155    164        ATP III CLASSIFICATION:  <200     mg/dL   Desirable  200-239  mg/dL   Borderline High  >=240    mg/dL   High   TRIG 150 (H) 11/24/2006 2155   HDL 48 11/24/2006 2155   CHOLHDL 3.4 11/24/2006 2155   VLDL 30 11/24/2006 2155   [place]  11/24/2006 2155      86        Total Cholesterol/HDL:CHD Risk Coronary Heart Disease Risk Table                     Men   Women  1/2 Average Risk   3.4   3.3  Physical Exam:    VS:  BP (!) 150/72   Pulse 60   Temp (!) 96.4 F (35.8 C)   Ht 5' 1.5" (1.562 m)   Wt 174 lb (78.9 kg)   SpO2 97%   BMI 32.34 kg/m     Wt Readings from Last 3 Encounters:  10/27/19 174 lb (78.9 kg)  12/21/18 175 lb (79.4 kg)  12/07/18 175 lb (79.4 kg)     GEN: in no acute distress HEENT: Normal NECK: No JVD; No carotid bruits CARDIAC: RRR, no murmurs, rubs, gallops RESPIRATORY:  Clear to auscultation without rales, wheezing or rhonchi  ABDOMEN: Soft, non-tender, non-distended MUSCULOSKELETAL:  No edema; No deformity  SKIN: Warm and dry NEUROLOGIC:  Alert and oriented x 3 PSYCHIATRIC:  Normal affect   ASSESSMENT:    1. Pre-op evaluation   2. Leg edema   3. Essential hypertension    PLAN:     Preop evaluation: Prior to knee surgery.  No symptoms to suggest active cardiac condition.  Functional capacity greater than 4 METS.  RCRI score 0.  Overall would classify as low risk for an intermediate risk surgery.  Checking TTE as below given lower extremity edema.  If unremarkable, no further cardiac work-up recommended  Left lower extremity edema: Asymmetric.  Will check lower extremity duplex to rule out DVT.  TTE to evaluate for structural heart disease.  Hypertension: On atenolol 50 mg daily and benazepril 20 mg daily.  BP elevated in clinic today but reports has been well controlled at home.  Will continue to monitor.  Type 2 diabetes: A1c is 7.3.  She is on [place] and Metformin  RTC in 3 months   Medication Adjustments/Labs and Tests Ordered: Current medicines are reviewed at length with the patient today.  Concerns regarding medicines are outlined above.  Orders Placed This Encounter  Procedures  . EKG 12-Lead  . ECHOCARDIOGRAM COMPLETE  . VAS [place] LOWER EXTREMITY VENOUS (DVT)   No orders of the  defined types were placed in this encounter.   Patient Instructions  Medication Instructions:  Your physician recommends that you continue on your current medications as directed. Please refer to the Current Medication list given to you today.  Testing/Procedures: Your physician has requested that you have an echocardiogram. Echocardiography is a painless test that uses sound waves to create images of your heart. It provides your doctor with information about the size and shape of your heart and how well your heart's chambers and valves are working. This procedure takes approximately one hour. There are no restrictions for this procedure. This will be done at our [facility] location:  [place] has requested that you have a lower extremity venous duplex. This test is an ultrasound of the veins in the legs. It looks at venous blood flow that carries blood from the heart to the legs or arms. Allow one hour for a Lower Venous exam. There are no restrictions or special instructions.  Follow-Up: At [facility], you and your health needs are our priority.  As part of our continuing mission to provide you with exceptional heart care, we have created designated Provider Care Teams.  These Care Teams include your primary Cardiologist (physician) and Advanced Practice Providers (APPs -  Physician Assistants and Nurse Practitioners) who all work together to provide you with the care you need, when you need it.  We recommend signing up for the patient portal called "MyChart".  Sign up  information is provided on this After Visit Summary.  MyChart is used to connect with patients for Virtual Visits (Telemedicine).  Patients are able to view lab/test results, encounter notes, upcoming appointments, etc.  Non-urgent messages can be sent to your provider as well.   To learn more about what you can do with MyChart, go to [email or web address].    Your next appointment:     3 month(s)  The format for your next appointment:   In Person  Provider:   Oswaldo Milian, MD        Signed, Donato Heinz, MD  10/29/2019 3:39 PM    [place]

## 2019-10-27 NOTE — Patient Instructions (Signed)
Medication Instructions:  Your physician recommends that you continue on your current medications as directed. Please refer to the Current Medication list given to you today.  Testing/Procedures: Your physician has requested that you have an echocardiogram. Echocardiography is a painless test that uses sound waves to create images of your heart. It provides your doctor with information about the size and shape of your heart and how well your heart's chambers and valves are working. This procedure takes approximately one hour. There are no restrictions for this procedure. This will be done at our [facility] location:  [place] has requested that you have a lower extremity venous duplex. This test is an ultrasound of the veins in the legs. It looks at venous blood flow that carries blood from the heart to the legs or arms. Allow one hour for a Lower Venous exam. There are no restrictions or special instructions.  Follow-Up: At [facility], you and your health needs are our priority.  As part of our continuing mission to provide you with exceptional heart care, we have created designated Provider Care Teams.  These Care Teams include your primary Cardiologist (physician) and Advanced Practice Providers (APPs -  Physician Assistants and Nurse Practitioners) who all work together to provide you with the care you need, when you need it.  We recommend signing up for the patient portal called "MyChart".  Sign up information is provided on this After Visit Summary.  MyChart is used to connect with patients for Virtual Visits (Telemedicine).  Patients are able to view lab/test results, encounter notes, upcoming appointments, etc.  Non-urgent messages can be sent to your provider as well.   To learn more about what you can do with MyChart, go to [email or web address].    Your next appointment:   3 month(s)  The format for your next appointment:   In  Person  Provider:   Oswaldo Milian, MD

## 2019-11-01 ENCOUNTER — Other Ambulatory Visit: Payer: Self-pay

## 2019-11-01 ENCOUNTER — Ambulatory Visit (HOSPITAL_COMMUNITY)
Admission: RE | Admit: 2019-11-01 | Discharge: 2019-11-01 | Disposition: A | Discharge status: Home / Self Care | Payer: Medicare Other | Source: Ambulatory Visit | Attending: Cardiovascular Disease | Admitting: Cardiovascular Disease

## 2019-11-01 DIAGNOSIS — R600 Localized edema: Secondary | ICD-10-CM | POA: Diagnosis present

## 2019-11-14 ENCOUNTER — Ambulatory Visit: Payer: Self-pay | Admitting: Orthopedic Surgery

## 2019-11-15 ENCOUNTER — Telehealth: Payer: Self-pay | Admitting: *Deleted

## 2019-11-15 NOTE — Telephone Encounter (Signed)
   [facility] Pre-operative Risk Assessment    HEARTCARE STAFF: - Please ensure there is not already an duplicate clearance open for this procedure. - Under Visit Info/Reason for Call, type in Other and utilize the format Clearance MM/DD/YY or Clearance TBD. Do not use dashes or single digits. - If request is for dental extraction, please clarify the # of teeth to be extracted.  Request for surgical clearance:  1. What type of surgery is being performed? RIGHT TOTAL KNEE ARTHROPLASTY  2. When is this surgery scheduled? 12/06/19  3. What type of clearance is required (medical clearance vs. Pharmacy clearance to hold med vs. Both)? MEDICAL  4. Are there any medications that need to be held prior to surgery and how long?NONE  5. Practice name and name of physician performing surgery? [facility]   6. What is the office phone number? [place]   What is the office fax number? [phone]  8.   Anesthesia type (None, local, MAC, general) ? SPINAL   [name] [name] 11/15/2019, 4:39 PM  _________________________________________________________________   (provider comments below)

## 2019-11-20 ENCOUNTER — Other Ambulatory Visit: Payer: Self-pay

## 2019-11-20 ENCOUNTER — Ambulatory Visit (HOSPITAL_COMMUNITY): Payer: Medicare Other | Attending: Cardiology

## 2019-11-20 DIAGNOSIS — R600 Localized edema: Secondary | ICD-10-CM | POA: Diagnosis not present

## 2019-11-22 NOTE — Telephone Encounter (Signed)
° °  Primary Cardiologist: No primary care provider on file.  Chart reviewed as part of pre-operative protocol coverage. Given past medical history and time since last visit, based on ACC/AHA guidelines, [name] [name] would be at acceptable risk for the planned procedure without further cardiovascular testing.   I will route this recommendation to the requesting party via Epic fax function and remove from pre-op pool.  Please call with questions.  Jossie Ng. [name] NP-C    11/22/2019, 11:25 AM [place] [address] Office [phone] Fax [phone]

## 2019-11-22 NOTE — Telephone Encounter (Signed)
TTE unremarkable, no further work-up recommended prior to surgery.

## 2019-11-23 NOTE — Patient Instructions (Addendum)
DUE TO COVID-19 ONLY ONE VISITOR IS ALLOWED TO COME WITH YOU AND STAY IN THE WAITING ROOM ONLY DURING PRE OP AND PROCEDURE DAY OF SURGERY. THE 2 VISITOR MAY VISIT WITH YOU AFTER SURGERY IN YOUR PRIVATE ROOM DURING VISITING HOURS ONLY!  YOU NEED TO HAVE A COVID 19 TEST ON_Saturday 6/26______ @__10 :45_____, THIS TEST MUST BE DONE BEFORE SURGERY, COME  [address], [place] , [id number].  ([place]) ONCE YOUR COVID TEST IS COMPLETED, PLEASE BEGIN THE QUARANTINE INSTRUCTIONS AS OUTLINED IN YOUR HANDOUT.                [name] [name]    Your procedure is scheduled on: 12/06/19   Report to [facility] Main  Entrance   Report to Short Stay at 5:30 AM     Call this number if you have problems the morning of surgery [place], NO CHEWING GUM [place].   Do not eat food After Midnight.   YOU MAY HAVE CLEAR LIQUIDS FROM MIDNIGHT UNTIL 4:30 AM  . At 4:30 AM Please finish the prescribed Pre-Surgery Gatorade drink.   Nothing by mouth after you finish the Gatorade drink !    Take these medicines the morning of surgery with A SIP OF WATER: Estrace, Omeprizole  DO NOT TAKE ANY DIABETIC MEDICATIONS DAY OF YOUR SURGERY       How to Manage Your Diabetes Before and After Surgery  Why is it important to control my blood sugar before and after surgery? . Improving blood sugar levels before and after surgery helps healing and can limit problems. . A way of improving blood sugar control is eating a healthy diet by: o  Eating less sugar and carbohydrates o  Increasing activity/exercise o  Talking with your doctor about reaching your blood sugar goals . High blood sugars (greater than 180 mg/dL) can raise your risk of infections and slow your recovery, so you will need to focus on controlling your diabetes during the weeks before surgery. . Make sure that the doctor who takes care of your  diabetes knows about your planned surgery including the date and location.  How do I manage my blood sugar before surgery? . Check your blood sugar at least 4 times a day, starting 2 days before surgery, to make sure that the level is not too high or low. o Check your blood sugar the morning of your surgery when you wake up and every 2 hours until you get to the Short Stay unit. . If your blood sugar is less than 70 mg/dL, you will need to treat for low blood sugar: o Do not take insulin. o Treat a low blood sugar (less than 70 mg/dL) with  cup of clear juice (cranberry or apple), 4 glucose tablets, OR glucose gel. o Recheck blood sugar in 15 minutes after treatment (to make sure it is greater than 70 mg/dL). If your blood sugar is not greater than 70 mg/dL on recheck, call [phone] for further instructions. . Report your blood sugar to the short stay nurse when you get to Short Stay.  . If you are admitted to the hospital after surgery: o Your blood sugar will be checked by the staff and you will probably be given insulin after surgery (instead of oral diabetes medicines) to make sure you have good blood sugar levels. o The goal for blood sugar control after  surgery is 80-180 mg/dL.   WHAT DO I DO ABOUT MY DIABETES MEDICATION?  Marland Kitchen Do not take oral diabetes medicines (pills) the morning of surgery.                      You may not have any metal on your body including hair pins and              piercings  Do not wear jewelry, make-up, lotions, powders or perfumes, deodorant             Do not wear nail polish on your fingernails.              Do not shave  48 hours prior to surgery.               Do not bring valuables to the hospital. [place].  Contacts, dentures or bridgework may not be worn into surgery.  .     Patients discharged the day of surgery will not be allowed to drive home.   IF YOU ARE HAVING SURGERY AND GOING  HOME THE SAME DAY, YOU MUST HAVE AN ADULT TO DRIVE YOU HOME AND BE WITH YOU FOR 24 HOURS.   YOU MAY GO HOME BY TAXI OR UBER OR ORTHERWISE, BUT AN ADULT MUST ACCOMPANY YOU HOME AND STAY WITH YOU FOR 24 HOURS.  Name and phone number of your driver:  Special Instructions: N/A              Please read over the following fact sheets you were given: _____________________________________________________________________             [facility] - Preparing for Surgery  Before surgery, you can play an important role.   Because skin is not sterile, your skin needs to be as free of germs as possible.   You can reduce the number of germs on your skin by washing with CHG (chlorahexidine gluconate) soap before surgery.   CHG is an antiseptic cleaner which kills germs and bonds with the skin to continue killing germs even after washing. Please DO NOT use if you have an allergy to CHG or antibacterial soaps.   If your skin becomes reddened/irritated stop using the CHG and inform your nurse when you arrive at Short Stay. Do not shave (including legs and underarms) for at least 48 hours prior to the first CHG shower. . Please follow these instructions carefully:  1.  Shower with CHG Soap the night before surgery and the  morning of Surgery.  2.  If you choose to wash your hair, wash your hair first as usual with your  normal  shampoo.  3.  After you shampoo, rinse your hair and body thoroughly to remove the  shampoo.                                        4.  Use CHG as you would any other liquid soap.  You can apply chg directly  to the skin and wash                       Gently with a scrungie or clean washcloth.  5.  Apply the CHG Soap to your body ONLY FROM THE NECK  DOWN.   Do not use on face/ open                           Wound or open sores. Avoid contact with eyes, ears mouth and genitals (private parts).                       Wash face,  Genitals (private parts) with your normal soap.              6.  Wash thoroughly, paying special attention to the area where your surgery  will be performed.  7.  Thoroughly rinse your body with warm water from the neck down.  8.  DO NOT shower/wash with your normal soap after using and rinsing off  the CHG Soap.             9.  Pat yourself dry with a clean towel.            10.  Wear clean pajamas.            11.  Place clean sheets on your bed the night of your first shower and do not  sleep with pets. Day of Surgery : Do not apply any lotions/deodorants the morning of surgery.  Please wear clean clothes to the hospital/surgery center.  FAILURE TO FOLLOW THESE INSTRUCTIONS MAY RESULT IN THE CANCELLATION OF YOUR SURGERY PATIENT SIGNATURE_________________________________  NURSE SIGNATURE__________________________________  ________________________________________________________________________   [name] [name]  An incentive spirometer is a tool that can help keep your lungs clear and active. This tool measures how well you are filling your lungs with each breath. Taking long deep breaths may help reverse or decrease the chance of developing breathing (pulmonary) problems (especially infection) following:  A long period of time when you are unable to move or be active. BEFORE THE PROCEDURE   If the spirometer includes an indicator to show your best effort, your nurse or respiratory therapist will set it to a desired goal.  If possible, sit up straight or lean slightly forward. Try not to slouch.  Hold the incentive spirometer in an upright position. INSTRUCTIONS FOR USE  1. Sit on the edge of your bed if possible, or sit up as far as you can in bed or on a chair. 2. Hold the incentive spirometer in an upright position. 3. Breathe out normally. 4. Place the mouthpiece in your mouth and seal your lips tightly around it. 5. Breathe in slowly and as deeply as possible, raising the piston or the ball toward the top of the column. 6. Hold  your breath for 3-5 seconds or for as long as possible. Allow the piston or ball to fall to the bottom of the column. 7. Remove the mouthpiece from your mouth and breathe out normally. 8. Rest for a few seconds and repeat Steps 1 through 7 at least 10 times every 1-2 hours when you are awake. Take your time and take a few normal breaths between deep breaths. 9. The spirometer may include an indicator to show your best effort. Use the indicator as a goal to work toward during each repetition. 10. After each set of 10 deep breaths, practice coughing to be sure your lungs are clear. If you have an incision (the cut made at the time of surgery), support your incision when coughing by placing a pillow or rolled up towels firmly against it. Once you are able to get out  of bed, walk around indoors and cough well. You may stop using the incentive spirometer when instructed by your caregiver.  RISKS AND COMPLICATIONS  Take your time so you do not get dizzy or light-headed.  If you are in pain, you may need to take or ask for pain medication before doing incentive spirometry. It is harder to take a deep breath if you are having pain. AFTER USE  Rest and breathe slowly and easily.  It can be helpful to keep track of a log of your progress. Your caregiver can provide you with a simple table to help with this. If you are using the spirometer at home, follow these instructions: [place] IF:   You are having difficultly using the spirometer.  You have trouble using the spirometer as often as instructed.  Your pain medication is not giving enough relief while using the spirometer.  You develop fever of 100.5 F (38.1 C) or higher. SEEK IMMEDIATE MEDICAL CARE IF:   You cough up bloody sputum that had not been present before.  You develop fever of 102 F (38.9 C) or greater.  You develop worsening pain at or near the incision site. MAKE SURE YOU:   Understand these instructions.  Will  watch your condition.  Will get help right away if you are not doing well or get worse. Document Released: 10/05/2006 Document Revised: 08/17/2011 Document Reviewed: 12/06/2006 [facility] Patient Information 2014 [place], [place].   ________________________________________________________________________

## 2019-11-24 ENCOUNTER — Encounter (HOSPITAL_COMMUNITY)
Admission: RE | Admit: 2019-11-24 | Discharge: 2019-11-24 | Disposition: A | Discharge status: Home / Self Care | Payer: Medicare Other | Source: Ambulatory Visit | Attending: Orthopedic Surgery | Admitting: Orthopedic Surgery

## 2019-11-24 ENCOUNTER — Encounter (HOSPITAL_COMMUNITY): Payer: Self-pay

## 2019-11-24 ENCOUNTER — Other Ambulatory Visit: Payer: Self-pay

## 2019-11-24 DIAGNOSIS — Z01818 Encounter for other preprocedural examination: Secondary | ICD-10-CM | POA: Insufficient documentation

## 2019-11-24 NOTE — Progress Notes (Signed)
COVID Vaccine Completed:Yes Date COVID Vaccine completed:07/19/19 COVID vaccine manufacturer: Pfizer     PCP - Dr. Jerilynn Mages. [place] Cardiologist - Dr. Levada Dy  Chest x-ray - no EKG - 10/27/19 Stress Test - no ECHO- 11/20/19 Cardiac Cath -no   Sleep Study - no CPAP -   Fasting Blood Sugar - 149-180 Checks Blood Sugar _QD____ times a day  Blood Thinner Instructions:ASA Aspirin Instructions:Pt will see Dr. Lyla Glassing and discuss with him Last Dose:6/23  Anesthesia review:   Patient denies shortness of breath, fever, cough and chest pain at PAT appointmentyes   Patient verbalized understanding of instructions that were given to them at the PAT appointment. Patient was also instructed that they will need to review over the PAT instructions again at home before surgery.yes No SOB with ADLs. Pt fell in her yard and hurt her Lt wrist on 11/16/19. It was examined and no brake noted.

## 2019-11-27 ENCOUNTER — Ambulatory Visit: Payer: Self-pay | Admitting: Orthopedic Surgery

## 2019-11-27 ENCOUNTER — Encounter (HOSPITAL_COMMUNITY)
Admission: RE | Admit: 2019-11-27 | Discharge: 2019-11-27 | Disposition: A | Discharge status: Home / Self Care | Payer: Medicare Other | Source: Ambulatory Visit | Attending: Orthopedic Surgery | Admitting: Orthopedic Surgery

## 2019-11-27 ENCOUNTER — Other Ambulatory Visit: Payer: Self-pay

## 2019-11-27 DIAGNOSIS — Z01818 Encounter for other preprocedural examination: Secondary | ICD-10-CM | POA: Diagnosis not present

## 2019-11-27 LAB — URINALYSIS, ROUTINE W REFLEX MICROSCOPIC
Bilirubin Urine: NEGATIVE
Glucose, UA: >=500 mg/dL — AB
Ketones, ur: NEGATIVE mg/dL
Leukocytes,Ua: NEGATIVE
Nitrite: NEGATIVE
Protein, ur: NEGATIVE mg/dL
Specific Gravity, Urine: 1.017 (ref 1.005–1.030)
pH: 5 (ref 5.0–8.0)

## 2019-11-27 LAB — CBC
HCT: 47.2 % — ABNORMAL HIGH (ref 36.0–46.0)
Hemoglobin: 15 g/dL (ref 12.0–15.0)
MCH: 32.5 pg (ref 26.0–34.0)
MCHC: 31.8 g/dL (ref 30.0–36.0)
MCV: 102.4 fL — ABNORMAL HIGH (ref 80.0–100.0)
Platelets: 274 10*3/uL (ref 150–400)
RBC: 4.61 MIL/uL (ref 3.87–5.11)
RDW: 12.9 % (ref 11.5–15.5)
WBC: 9.8 10*3/uL (ref 4.0–10.5)
nRBC: 0 % (ref 0.0–0.2)

## 2019-11-27 LAB — COMPREHENSIVE METABOLIC PANEL
ALT: 16 U/L (ref 0–44)
AST: 17 U/L (ref 15–41)
Albumin: 4.1 g/dL (ref 3.5–5.0)
Alkaline Phosphatase: 41 U/L (ref 38–126)
Anion gap: 10 (ref 5–15)
BUN: 14 mg/dL (ref 8–23)
CO2: 27 mmol/L (ref 22–32)
Calcium: 9.1 mg/dL (ref 8.9–10.3)
Chloride: 102 mmol/L (ref 98–111)
Creatinine, Ser: 0.64 mg/dL (ref 0.44–1.00)
GFR calc Af Amer: >60 mL/min (ref >60)
GFR calc non Af Amer: >60 mL/min (ref >60)
Glucose, Bld: 159 mg/dL — ABNORMAL HIGH (ref 70–99)
Potassium: 4.1 mmol/L (ref 3.5–5.1)
Sodium: 139 mmol/L (ref 135–145)
Total Bilirubin: 1 mg/dL (ref 0.3–1.2)
Total Protein: 7.1 g/dL (ref 6.5–8.1)

## 2019-11-27 LAB — SURGICAL PCR SCREEN
MRSA, PCR: NEGATIVE
Staphylococcus aureus: NEGATIVE

## 2019-11-27 LAB — PROTIME-INR
INR: 1 (ref 0.8–1.2)
Prothrombin Time: 12.4 seconds (ref 11.4–15.2)

## 2019-11-27 LAB — GLUCOSE, CAPILLARY: Glucose-Capillary: 151 mg/dL — ABNORMAL HIGH (ref 70–99)

## 2019-11-27 LAB — ABO/RH: ABO/RH(D): O POS

## 2019-11-27 NOTE — H&P (Signed)
TOTAL KNEE ADMISSION H&P  Patient is being admitted for left total knee arthroplasty.  Subjective:  Chief Complaint:left knee pain.  HPI: [name] [name], 74 y.o. female, has a history of pain and functional disability in the left knee due to arthritis and has failed non-surgical conservative treatments for greater than 12 weeks to includeNSAID's and/or analgesics, corticosteriod injections and activity modification.  Onset of symptoms was gradual, starting 8 years ago with gradually worsening course since that time. The patient noted no past surgery on the left knee(s).  Patient currently rates pain in the left knee(s) at 8 out of 10 with activity. Patient has worsening of pain with activity and weight bearing, pain that interferes with activities of daily living and joint swelling.  Patient has evidence of joint space narrowing by imaging studies. There is no active infection.  Patient Active Problem List   Diagnosis Date Noted  . Spinal stenosis, lumbar region, with neurogenic claudication 04/21/2013  . Low back pain on left side with sciatica 03/01/2013  . Preoperative cardiovascular examination 03/01/2013  . DM2 (diabetes mellitus, type 2) ([place]) 03/01/2013  . Dyslipidemia 03/01/2013  . PSEUDOCYST, PANCREAS 11/21/2007  . OBESITY 09/21/2007  . HYPERTENSION 09/21/2007  . GERD 09/21/2007  . HIATAL HERNIA 09/21/2007  . POLYMYALGIA RHEUMATICA 09/21/2007  . HYPOTHYROIDISM, HX OF 09/21/2007  . PANCREATITIS, HX OF 09/21/2007  . ESOPHAGITIS, HX OF 09/21/2007   Past Medical History:  Diagnosis Date  . Anxiety    occ  . DDD (degenerative disc disease)   . DJD (degenerative joint disease)   . DM type 2 (diabetes mellitus, type 2) ([place])   . GERD (gastroesophageal reflux disease)   . Hyperlipidemia   . Hypertension   . Hypothyroidism   . Pancreatitis    h/o, twice    Past Surgical History:  Procedure Laterality Date  . BREAST CYST EXCISION     left, benign   . COLONOSCOPY    .  LUMBAR LAMINECTOMY N/A 04/21/2013   Procedure: CENTRAL MICRODISCECTOMY AND COMPLETE DECOMPRESSION OF LUMBAR LAMINECTOMY L4-L5 ;  Surgeon: Tobi Bastos, MD;  Location: [facility];  Service: Orthopedics;  Laterality: N/A;  . POLYPECTOMY    . [place]   right  . TOTAL ABDOMINAL HYSTERECTOMY  1984    Current Outpatient Medications  Medication Sig Dispense Refill Last Dose  . acetaminophen (TYLENOL) 325 MG tablet Take 650 mg by mouth every 6 (six) hours as needed for mild pain, moderate pain or headache.     Marland Kitchen amoxicillin (AMOXIL) 500 MG tablet Take 500 mg by mouth 3 (three) times daily.     Marland Kitchen ascorbic acid (GNP VITAMIN C) 500 MG tablet Take 500 mg by mouth daily.     Marland Kitchen aspirin EC 81 MG tablet Take 81 mg by mouth daily.     Marland Kitchen atenolol (TENORMIN) 50 MG tablet Take 25 mg by mouth at bedtime.      Marland Kitchen atorvastatin (LIPITOR) 10 MG tablet Take 10 mg by mouth daily.     . benazepril (LOTENSIN) 20 MG tablet Take 20 mg by mouth at bedtime.      . Calcium-Magnesium-Zinc (CAL-MAG-ZINC PO) Take 1 tablet by mouth daily.     . cholecalciferol (VITAMIN D) 25 MCG (1000 UNIT) tablet Take 1,000 Units by mouth daily.     . Cyanocobalamin (VITAMIN B-12) 2500 MCG SUBL Place 2,500 mcg under the tongue daily.     Marland Kitchen estradiol (ESTRACE) 0.5 MG tablet Take 0.5 mg by  mouth daily.      Marland Kitchen FARXIGA 10 MG TABS tablet Take 10 mg by mouth daily.     . furosemide (LASIX) 20 MG tablet Take 20 mg by mouth daily as needed (swelling/fluid retention.).      Marland Kitchen glucose blood test strip USE TO TEST BLOOD SUGAR ONCE DAILY     . levothyroxine (SYNTHROID, LEVOTHROID) 88 MCG tablet Take 88 mcg by mouth daily before breakfast.     . metFORMIN (GLUCOPHAGE-XR) 500 MG 24 hr tablet Take 500-1,000 mg by mouth See admin instructions. Take 2 tablets (1000 mg) by mouth in the morning & take 1 tablet (500 mg) by mouth at night.     . naproxen sodium (ALEVE) 220 MG tablet Take 220-440 mg by mouth 2 (two) times daily as needed (pain.).      Marland Kitchen omeprazole (PRILOSEC) 40 MG capsule Take 40 mg by mouth daily.      . potassium chloride (KLOR-CON) 10 MEQ tablet Take 10 mEq by mouth daily as needed (with lasix (fluid/swelling)).      . traMADol (ULTRAM) 50 MG tablet Take 50-100 mg by mouth every 6 (six) hours as needed for moderate pain or severe pain.       No current facility-administered medications for this visit.   Allergies  Allergen Reactions  . Codeine Itching  . Hydrocodone Itching  . Penicillins Itching  . Sulfa Antibiotics Hives and Itching    Social History   Tobacco Use  . Smoking status: Never Smoker  . Smokeless tobacco: Never Used  Substance Use Topics  . Alcohol use: No    Family History  Problem Relation Age of Onset  . Dementia Mother        age 64 in 2014, heart disease  . Heart disease Father        CAD, CABG  . Heart disease Brother        HTN, CBAG  . Hypertension Brother   . Fibromyalgia Sister   . Colon cancer Neg Hx   . Colon polyps Neg Hx   . Esophageal cancer Neg Hx   . Rectal cancer Neg Hx   . Stomach cancer Neg Hx      Review of Systems  Constitutional: Negative for chills and fever.  HENT: Negative for sore throat.   Respiratory: Negative for cough and shortness of breath.   Cardiovascular: Negative for chest pain.  Gastrointestinal: Negative for abdominal pain, nausea and vomiting.  Endocrine: Negative for polydipsia, polyphagia and polyuria.  Musculoskeletal: Positive for joint swelling.  Skin: Negative for wound.  Allergic/Immunologic: Negative for immunocompromised state.  Neurological: Negative for syncope.  Hematological: Negative for adenopathy.  Psychiatric/Behavioral: Negative for confusion.    Objective:  Physical Exam  Constitutional: She is oriented to person, place, and time.  HENT:  Head: Normocephalic and atraumatic.  Eyes: Pupils are equal, round, and reactive to light.  Cardiovascular: Normal rate and regular rhythm. Exam reveals no gallop and no  friction rub.  No murmur heard. Respiratory: Effort normal and breath sounds normal.  GI: Soft. Normal appearance. There is no abdominal tenderness.  Genitourinary:    Genitourinary Comments: Deferred   Musculoskeletal:     Cervical back: Normal range of motion.     Left knee: Swelling and bony tenderness present. Decreased range of motion.  Neurological: She is alert and oriented to person, place, and time.  Skin: Skin is warm and dry. Capillary refill takes less than 2 seconds.  Psychiatric: Her behavior is normal.  Mood normal.    Vital signs in last 24 hours: @VSRANGES @  Labs:   Estimated body mass index is 33.15 kg/m as calculated from the following:   Height as of an earlier encounter on 11/27/19: 5\' 1"  (1.549 m).   Weight as of an earlier encounter on 11/27/19: 79.6 kg.   Imaging Review Plain radiographs demonstrate severe degenerative joint disease of the left knee(s).The bone quality appears to be adequate for age and reported activity level.      Assessment/Plan:  End stage arthritis, left knee   The patient history, physical examination, clinical judgment of the provider and imaging studies are consistent with end stage degenerative joint disease of the left knee(s) and total knee arthroplasty is deemed medically necessary. The treatment options including medical management, injection therapy arthroscopy and arthroplasty were discussed at length. The risks and benefits of total knee arthroplasty were presented and reviewed. The risks due to aseptic loosening, infection, stiffness, patella tracking problems, thromboembolic complications and other imponderables were discussed. The patient acknowledged the explanation, agreed to proceed with the plan and consent was signed. Patient is being admitted for inpatient treatment for surgery, pain control, PT, OT, prophylactic antibiotics, VTE prophylaxis, progressive ambulation and ADL's and discharge planning. The patient is  planning to be discharged home and will have physical therapy at [facility]     Patient's anticipated LOS is less than 2 midnights, meeting these requirements: - Lives within 1 hour of care - Has a competent adult at home to recover with post-op recover - NO history of  - Chronic pain requiring opiods  - Coronary Artery Disease  - Heart failure  - Heart attack  - Stroke  - DVT/VTE  - Cardiac arrhythmia  - Respiratory Failure/COPD  - Renal failure  - Anemia  - Advanced Liver disease

## 2019-11-27 NOTE — H&P (View-Only) (Signed)
TOTAL KNEE ADMISSION H&P  Patient is being admitted for left total knee arthroplasty.  Subjective:  Chief Complaint:left knee pain.  HPI: [name] [name], 74 y.o. female, has a history of pain and functional disability in the left knee due to arthritis and has failed non-surgical conservative treatments for greater than 12 weeks to includeNSAID's and/or analgesics, corticosteriod injections and activity modification.  Onset of symptoms was gradual, starting 8 years ago with gradually worsening course since that time. The patient noted no past surgery on the left knee(s).  Patient currently rates pain in the left knee(s) at 8 out of 10 with activity. Patient has worsening of pain with activity and weight bearing, pain that interferes with activities of daily living and joint swelling.  Patient has evidence of joint space narrowing by imaging studies. There is no active infection.  Patient Active Problem List   Diagnosis Date Noted   Spinal stenosis, lumbar region, with neurogenic claudication 04/21/2013   Low back pain on left side with sciatica 03/01/2013   Preoperative cardiovascular examination 03/01/2013   DM2 (diabetes mellitus, type 2) ([place]) 03/01/2013   Dyslipidemia 03/01/2013   PSEUDOCYST, PANCREAS 11/21/2007   OBESITY 09/21/2007   HYPERTENSION 09/21/2007   GERD 09/21/2007   HIATAL HERNIA 09/21/2007   POLYMYALGIA RHEUMATICA 09/21/2007   HYPOTHYROIDISM, HX OF 09/21/2007   PANCREATITIS, HX OF 09/21/2007   ESOPHAGITIS, HX OF 09/21/2007   Past Medical History:  Diagnosis Date   Anxiety    occ   DDD (degenerative disc disease)    DJD (degenerative joint disease)    DM type 2 (diabetes mellitus, type 2) ([place])    GERD (gastroesophageal reflux disease)    Hyperlipidemia    Hypertension    Hypothyroidism    Pancreatitis    h/o, twice    Past Surgical History:  Procedure Laterality Date   BREAST CYST EXCISION     left, benign    COLONOSCOPY      LUMBAR LAMINECTOMY N/A 04/21/2013   Procedure: CENTRAL MICRODISCECTOMY AND COMPLETE DECOMPRESSION OF LUMBAR LAMINECTOMY L4-L5 ;  Surgeon: Tobi Bastos, MD;  Location: [facility];  Service: Orthopedics;  Laterality: N/A;   POLYPECTOMY     ROTATOR CUFF REPAIR  1995   right   TOTAL ABDOMINAL HYSTERECTOMY  1984    Current Outpatient Medications  Medication Sig Dispense Refill Last Dose   acetaminophen (TYLENOL) 325 MG tablet Take 650 mg by mouth every 6 (six) hours as needed for mild pain, moderate pain or headache.      amoxicillin (AMOXIL) 500 MG tablet Take 500 mg by mouth 3 (three) times daily.      ascorbic acid (GNP VITAMIN C) 500 MG tablet Take 500 mg by mouth daily.      aspirin EC 81 MG tablet Take 81 mg by mouth daily.      atenolol (TENORMIN) 50 MG tablet Take 25 mg by mouth at bedtime.       atorvastatin (LIPITOR) 10 MG tablet Take 10 mg by mouth daily.      benazepril (LOTENSIN) 20 MG tablet Take 20 mg by mouth at bedtime.       Calcium-Magnesium-Zinc (CAL-MAG-ZINC PO) Take 1 tablet by mouth daily.      cholecalciferol (VITAMIN D) 25 MCG (1000 UNIT) tablet Take 1,000 Units by mouth daily.      Cyanocobalamin (VITAMIN B-12) 2500 MCG SUBL Place 2,500 mcg under the tongue daily.      estradiol (ESTRACE) 0.5 MG tablet Take 0.5 mg by  mouth daily.       FARXIGA 10 MG TABS tablet Take 10 mg by mouth daily.      furosemide (LASIX) 20 MG tablet Take 20 mg by mouth daily as needed (swelling/fluid retention.).       glucose blood test strip USE TO TEST BLOOD SUGAR ONCE DAILY      levothyroxine (SYNTHROID, LEVOTHROID) 88 MCG tablet Take 88 mcg by mouth daily before breakfast.      metFORMIN (GLUCOPHAGE-XR) 500 MG 24 hr tablet Take 500-1,000 mg by mouth See admin instructions. Take 2 tablets (1000 mg) by mouth in the morning & take 1 tablet (500 mg) by mouth at night.      naproxen sodium (ALEVE) 220 MG tablet Take 220-440 mg by mouth 2 (two) times daily as needed (pain.).       omeprazole (PRILOSEC) 40 MG capsule Take 40 mg by mouth daily.       potassium chloride (KLOR-CON) 10 MEQ tablet Take 10 mEq by mouth daily as needed (with lasix (fluid/swelling)).       traMADol (ULTRAM) 50 MG tablet Take 50-100 mg by mouth every 6 (six) hours as needed for moderate pain or severe pain.       No current facility-administered medications for this visit.   Allergies  Allergen Reactions   Codeine Itching   Hydrocodone Itching   Penicillins Itching   Sulfa Antibiotics Hives and Itching    Social History   Tobacco Use   Smoking status: Never Smoker   Smokeless tobacco: Never Used  Substance Use Topics   Alcohol use: No    Family History  Problem Relation Age of Onset   Dementia Mother        age 57 in 2014, heart disease   Heart disease Father        CAD, CABG   Heart disease Brother        HTN, CBAG   Hypertension Brother    Fibromyalgia Sister    Colon cancer Neg Hx    Colon polyps Neg Hx    Esophageal cancer Neg Hx    Rectal cancer Neg Hx    Stomach cancer Neg Hx      Review of Systems  Constitutional: Negative for chills and fever.  HENT: Negative for sore throat.   Respiratory: Negative for cough and shortness of breath.   Cardiovascular: Negative for chest pain.  Gastrointestinal: Negative for abdominal pain, nausea and vomiting.  Endocrine: Negative for polydipsia, polyphagia and polyuria.  Musculoskeletal: Positive for joint swelling.  Skin: Negative for wound.  Allergic/Immunologic: Negative for immunocompromised state.  Neurological: Negative for syncope.  Hematological: Negative for adenopathy.  Psychiatric/Behavioral: Negative for confusion.    Objective:  Physical Exam  Constitutional: She is oriented to person, place, and time.  HENT:  Head: Normocephalic and atraumatic.  Eyes: Pupils are equal, round, and reactive to light.  Cardiovascular: Normal rate and regular rhythm. Exam reveals no gallop and no  friction rub.  No murmur heard. Respiratory: Effort normal and breath sounds normal.  GI: Soft. Normal appearance. There is no abdominal tenderness.  Genitourinary:    Genitourinary Comments: Deferred   Musculoskeletal:     Cervical back: Normal range of motion.     Left knee: Swelling and bony tenderness present. Decreased range of motion.  Neurological: She is alert and oriented to person, place, and time.  Skin: Skin is warm and dry. Capillary refill takes less than 2 seconds.  Psychiatric: Her behavior is normal.  Mood normal.    Vital signs in last 24 hours: @VSRANGES @  Labs:   Estimated body mass index is 33.15 kg/m as calculated from the following:   Height as of an earlier encounter on 11/27/19: 5\' 1"  (1.549 m).   Weight as of an earlier encounter on 11/27/19: 79.6 kg.   Imaging Review Plain radiographs demonstrate severe degenerative joint disease of the left knee(s).The bone quality appears to be adequate for age and reported activity level.      Assessment/Plan:  End stage arthritis, left knee   The patient history, physical examination, clinical judgment of the provider and imaging studies are consistent with end stage degenerative joint disease of the left knee(s) and total knee arthroplasty is deemed medically necessary. The treatment options including medical management, injection therapy arthroscopy and arthroplasty were discussed at length. The risks and benefits of total knee arthroplasty were presented and reviewed. The risks due to aseptic loosening, infection, stiffness, patella tracking problems, thromboembolic complications and other imponderables were discussed. The patient acknowledged the explanation, agreed to proceed with the plan and consent was signed. Patient is being admitted for inpatient treatment for surgery, pain control, PT, OT, prophylactic antibiotics, VTE prophylaxis, progressive ambulation and ADL's and discharge planning. The patient is  planning to be discharged home and will have physical therapy at [facility]     Patient's anticipated LOS is less than 2 midnights, meeting these requirements: - Lives within 1 hour of care - Has a competent adult at home to recover with post-op recover - NO history of  - Chronic pain requiring opiods  - Coronary Artery Disease  - Heart failure  - Heart attack  - Stroke  - DVT/VTE  - Cardiac arrhythmia  - Respiratory Failure/COPD  - Renal failure  - Anemia  - Advanced Liver disease

## 2019-11-28 LAB — HEMOGLOBIN A1C
Hgb A1c MFr Bld: 7.2 % — ABNORMAL HIGH (ref 4.8–5.6)
Mean Plasma Glucose: 160 mg/dL

## 2019-12-02 ENCOUNTER — Other Ambulatory Visit (HOSPITAL_COMMUNITY)
Admission: RE | Admit: 2019-12-02 | Discharge: 2019-12-02 | Disposition: A | Discharge status: Home / Self Care | Payer: Medicare Other | Source: Ambulatory Visit | Attending: Orthopedic Surgery | Admitting: Orthopedic Surgery

## 2019-12-02 DIAGNOSIS — Z20822 Contact with and (suspected) exposure to covid-19: Secondary | ICD-10-CM | POA: Insufficient documentation

## 2019-12-02 DIAGNOSIS — Z01812 Encounter for preprocedural laboratory examination: Secondary | ICD-10-CM | POA: Insufficient documentation

## 2019-12-02 LAB — SARS CORONAVIRUS 2 (TAT 6-24 HRS): SARS Coronavirus 2: NEGATIVE

## 2019-12-06 ENCOUNTER — Ambulatory Visit (HOSPITAL_COMMUNITY): Payer: Medicare Other | Admitting: Certified Registered Nurse Anesthetist

## 2019-12-06 ENCOUNTER — Ambulatory Visit (HOSPITAL_COMMUNITY): Payer: Medicare Other

## 2019-12-06 ENCOUNTER — Encounter (HOSPITAL_COMMUNITY)
Admission: RE | Disposition: A | Discharge status: Home / Self Care | Payer: Self-pay | Source: Home / Self Care | Attending: Orthopedic Surgery

## 2019-12-06 ENCOUNTER — Encounter (HOSPITAL_COMMUNITY): Payer: Self-pay | Admitting: Orthopedic Surgery

## 2019-12-06 ENCOUNTER — Ambulatory Visit (HOSPITAL_COMMUNITY)
Admission: RE | Admit: 2019-12-06 | Discharge: 2019-12-06 | Disposition: A | Discharge status: Home / Self Care | Payer: Medicare Other | Attending: Orthopedic Surgery | Admitting: Orthopedic Surgery

## 2019-12-06 DIAGNOSIS — Z7982 Long term (current) use of aspirin: Secondary | ICD-10-CM | POA: Insufficient documentation

## 2019-12-06 DIAGNOSIS — E669 Obesity, unspecified: Secondary | ICD-10-CM | POA: Diagnosis not present

## 2019-12-06 DIAGNOSIS — E119 Type 2 diabetes mellitus without complications: Secondary | ICD-10-CM | POA: Insufficient documentation

## 2019-12-06 DIAGNOSIS — E039 Hypothyroidism, unspecified: Secondary | ICD-10-CM | POA: Diagnosis not present

## 2019-12-06 DIAGNOSIS — Z79890 Hormone replacement therapy (postmenopausal): Secondary | ICD-10-CM | POA: Insufficient documentation

## 2019-12-06 DIAGNOSIS — I10 Essential (primary) hypertension: Secondary | ICD-10-CM | POA: Diagnosis not present

## 2019-12-06 DIAGNOSIS — E785 Hyperlipidemia, unspecified: Secondary | ICD-10-CM | POA: Insufficient documentation

## 2019-12-06 DIAGNOSIS — Z6833 Body mass index (BMI) 33.0-33.9, adult: Secondary | ICD-10-CM | POA: Insufficient documentation

## 2019-12-06 DIAGNOSIS — Z7984 Long term (current) use of oral hypoglycemic drugs: Secondary | ICD-10-CM | POA: Diagnosis not present

## 2019-12-06 DIAGNOSIS — M353 Polymyalgia rheumatica: Secondary | ICD-10-CM | POA: Diagnosis not present

## 2019-12-06 DIAGNOSIS — K219 Gastro-esophageal reflux disease without esophagitis: Secondary | ICD-10-CM | POA: Diagnosis not present

## 2019-12-06 DIAGNOSIS — Z96652 Presence of left artificial knee joint: Secondary | ICD-10-CM

## 2019-12-06 DIAGNOSIS — Z79899 Other long term (current) drug therapy: Secondary | ICD-10-CM | POA: Diagnosis not present

## 2019-12-06 DIAGNOSIS — M1712 Unilateral primary osteoarthritis, left knee: Secondary | ICD-10-CM

## 2019-12-06 HISTORY — PX: KNEE ARTHROPLASTY: SHX992

## 2019-12-06 LAB — TYPE AND SCREEN
ABO/RH(D): O POS
Antibody Screen: NEGATIVE

## 2019-12-06 LAB — GLUCOSE, CAPILLARY
Glucose-Capillary: 176 mg/dL — ABNORMAL HIGH (ref 70–99)
Glucose-Capillary: 164 mg/dL — ABNORMAL HIGH (ref 70–99)

## 2019-12-06 SURGERY — ARTHROPLASTY, KNEE, TOTAL, USING IMAGELESS COMPUTER-ASSISTED NAVIGATION
Anesthesia: Spinal | Site: Knee | Laterality: Left

## 2019-12-06 MED ORDER — FENTANYL CITRATE (PF) 100 MCG/2ML IJ SOLN
INTRAMUSCULAR | Status: AC
  Filled 2019-12-06: qty 2

## 2019-12-06 MED ORDER — OXYCODONE HCL 5 MG PO TABS
ORAL_TABLET | ORAL | Status: AC
  Filled 2019-12-06: qty 1

## 2019-12-06 MED ORDER — SODIUM CHLORIDE 0.9% IV SOLUTION
INTRAVENOUS | Status: DC | PRN
  Administered 2019-12-06: 1000 mL via INTRAMUSCULAR

## 2019-12-06 MED ORDER — BUPIVACAINE-EPINEPHRINE (PF) 0.25% -1:200000 IJ SOLN
INTRAMUSCULAR | Status: AC
  Filled 2019-12-06: qty 30

## 2019-12-06 MED ORDER — SODIUM CHLORIDE 0.9 % IR SOLN
Status: DC | PRN
  Administered 2019-12-06: 3000 mL

## 2019-12-06 MED ORDER — ISOPROPYL ALCOHOL 70 % SOLN
Status: DC | PRN
  Administered 2019-12-06: 1 via TOPICAL

## 2019-12-06 MED ORDER — ISOPROPYL ALCOHOL 70 % SOLN
Status: AC
  Filled 2019-12-06: qty 480

## 2019-12-06 MED ORDER — METHOCARBAMOL 500 MG IVPB - SIMPLE MED
500.0000 mg | Freq: Four times a day (QID) | INTRAVENOUS | Status: DC | PRN
  Administered 2019-12-06: 500 mg via INTRAVENOUS

## 2019-12-06 MED ORDER — BUPIVACAINE-EPINEPHRINE 0.25% -1:200000 IJ SOLN
INTRAMUSCULAR | Status: DC | PRN
  Administered 2019-12-06: 30 mL

## 2019-12-06 MED ORDER — TRANEXAMIC ACID-NACL 1000-0.7 MG/100ML-% IV SOLN
1000.0000 mg | INTRAVENOUS | Status: AC
  Administered 2019-12-06: 1000 mg via INTRAVENOUS
  Filled 2019-12-06: qty 100

## 2019-12-06 MED ORDER — FENTANYL CITRATE (PF) 100 MCG/2ML IJ SOLN
INTRAMUSCULAR | Status: DC | PRN
  Administered 2019-12-06: 25 ug via INTRAVENOUS
  Administered 2019-12-06: 75 ug via INTRAVENOUS

## 2019-12-06 MED ORDER — ONDANSETRON HCL 4 MG PO TABS: 4.0000 mg | ORAL_TABLET | Freq: Three times a day (TID) | ORAL | 0 refills | Status: DC | PRN

## 2019-12-06 MED ORDER — SENNA 8.6 MG PO TABS
2.0000 | ORAL_TABLET | Freq: Every day | ORAL | 1 refills | Status: AC
Start: 2019-12-06 — End: 2020-02-04

## 2019-12-06 MED ORDER — SODIUM CHLORIDE 0.9 % IV SOLN: INTRAVENOUS | Status: DC

## 2019-12-06 MED ORDER — ONDANSETRON HCL 4 MG PO TABS
4.0000 mg | ORAL_TABLET | Freq: Four times a day (QID) | ORAL | Status: DC | PRN
  Filled 2019-12-06: qty 1

## 2019-12-06 MED ORDER — ACETAMINOPHEN 325 MG PO TABS: 325.0000 mg | ORAL_TABLET | Freq: Four times a day (QID) | ORAL | Status: DC | PRN

## 2019-12-06 MED ORDER — DEXAMETHASONE SODIUM PHOSPHATE 10 MG/ML IJ SOLN
INTRAMUSCULAR | Status: DC | PRN
Start: 2019-12-06 — End: 2019-12-06
  Administered 2019-12-06: 5 mg via INTRAVENOUS

## 2019-12-06 MED ORDER — ONDANSETRON HCL 4 MG/2ML IJ SOLN
INTRAMUSCULAR | Status: DC | PRN
  Administered 2019-12-06: 4 mg via INTRAVENOUS

## 2019-12-06 MED ORDER — PROPOFOL 10 MG/ML IV BOLUS
INTRAVENOUS | Status: DC | PRN
  Administered 2019-12-06 (×2): 10 mg via INTRAVENOUS
  Administered 2019-12-06: 20 mg via INTRAVENOUS
  Administered 2019-12-06 (×4): 10 mg via INTRAVENOUS

## 2019-12-06 MED ORDER — KETOROLAC TROMETHAMINE 30 MG/ML IJ SOLN
INTRAMUSCULAR | Status: AC
  Filled 2019-12-06: qty 1

## 2019-12-06 MED ORDER — BUPIVACAINE IN DEXTROSE 0.75-8.25 % IT SOLN
INTRATHECAL | Status: DC | PRN
Start: 2019-12-06 — End: 2019-12-06
  Administered 2019-12-06: 1.6 mL via INTRATHECAL

## 2019-12-06 MED ORDER — POVIDONE-IODINE 10 % EX SWAB: 2.0000 "application " | Freq: Once | CUTANEOUS | Status: DC

## 2019-12-06 MED ORDER — ONDANSETRON HCL 4 MG/2ML IJ SOLN: 4.0000 mg | Freq: Four times a day (QID) | INTRAMUSCULAR | Status: DC | PRN

## 2019-12-06 MED ORDER — CHLORHEXIDINE GLUCONATE 0.12 % MT SOLN
15.0000 mL | Freq: Once | OROMUCOSAL | Status: AC
  Administered 2019-12-06: 15 mL via OROMUCOSAL

## 2019-12-06 MED ORDER — KETOROLAC TROMETHAMINE 15 MG/ML IJ SOLN
INTRAMUSCULAR | Status: AC
  Filled 2019-12-06: qty 1

## 2019-12-06 MED ORDER — LACTATED RINGERS IV SOLN: INTRAVENOUS | Status: DC

## 2019-12-06 MED ORDER — POVIDONE-IODINE 10 % EX SWAB
2.0000 "application " | Freq: Once | CUTANEOUS | Status: AC
  Administered 2019-12-06: 2 via TOPICAL

## 2019-12-06 MED ORDER — LACTATED RINGERS IV BOLUS
500.0000 mL | Freq: Once | INTRAVENOUS | Status: AC
  Administered 2019-12-06: 500 mL via INTRAVENOUS

## 2019-12-06 MED ORDER — METHOCARBAMOL 500 MG IVPB - SIMPLE MED
INTRAVENOUS | Status: AC
  Filled 2019-12-06: qty 50

## 2019-12-06 MED ORDER — KETOROLAC TROMETHAMINE 30 MG/ML IJ SOLN
INTRAMUSCULAR | Status: DC | PRN
  Administered 2019-12-06: 30 mg

## 2019-12-06 MED ORDER — MIDAZOLAM HCL 2 MG/2ML IJ SOLN
INTRAMUSCULAR | Status: AC
  Filled 2019-12-06: qty 2

## 2019-12-06 MED ORDER — HYDROMORPHONE HCL 1 MG/ML IJ SOLN: 0.5000 mg | INTRAMUSCULAR | Status: DC | PRN

## 2019-12-06 MED ORDER — SODIUM CHLORIDE (PF) 0.9 % IJ SOLN
INTRAMUSCULAR | Status: DC | PRN
  Administered 2019-12-06: 30 mL

## 2019-12-06 MED ORDER — ORAL CARE MOUTH RINSE: 15.0000 mL | Freq: Once | OROMUCOSAL | Status: AC

## 2019-12-06 MED ORDER — PROPOFOL 1000 MG/100ML IV EMUL
INTRAVENOUS | Status: AC
  Filled 2019-12-06: qty 100

## 2019-12-06 MED ORDER — OXYCODONE HCL 5 MG PO TABS
5.0000 mg | ORAL_TABLET | ORAL | Status: DC | PRN
  Administered 2019-12-06 (×2): 5 mg via ORAL

## 2019-12-06 MED ORDER — METOCLOPRAMIDE HCL 5 MG/ML IJ SOLN: 5.0000 mg | Freq: Three times a day (TID) | INTRAMUSCULAR | Status: DC | PRN

## 2019-12-06 MED ORDER — LIDOCAINE 2% (20 MG/ML) 5 ML SYRINGE
INTRAMUSCULAR | Status: AC
  Filled 2019-12-06: qty 5

## 2019-12-06 MED ORDER — PROPOFOL 500 MG/50ML IV EMUL
INTRAVENOUS | Status: DC | PRN
  Administered 2019-12-06: 50 ug/kg/min via INTRAVENOUS

## 2019-12-06 MED ORDER — SODIUM CHLORIDE (PF) 0.9 % IJ SOLN
INTRAMUSCULAR | Status: AC
  Filled 2019-12-06: qty 50

## 2019-12-06 MED ORDER — METOCLOPRAMIDE HCL 5 MG PO TABS
5.0000 mg | ORAL_TABLET | Freq: Three times a day (TID) | ORAL | Status: DC | PRN
  Filled 2019-12-06: qty 2

## 2019-12-06 MED ORDER — 0.9 % SODIUM CHLORIDE (POUR BTL) OPTIME
TOPICAL | Status: DC | PRN
  Administered 2019-12-06: 1000 mL

## 2019-12-06 MED ORDER — ASPIRIN 81 MG PO CHEW
81.0000 mg | CHEWABLE_TABLET | Freq: Two times a day (BID) | ORAL | 0 refills | Status: AC
Start: 2019-12-06 — End: 2020-01-20

## 2019-12-06 MED ORDER — DOCUSATE SODIUM 100 MG PO CAPS
100.0000 mg | ORAL_CAPSULE | Freq: Two times a day (BID) | ORAL | 1 refills | Status: AC
Start: 2019-12-06 — End: 2020-02-04

## 2019-12-06 MED ORDER — BUPIVACAINE HCL (PF) 0.5 % IJ SOLN
INTRAMUSCULAR | Status: DC | PRN
Start: 2019-12-06 — End: 2019-12-06
  Administered 2019-12-06: 20 mL via PERINEURAL

## 2019-12-06 MED ORDER — HYDROMORPHONE HCL 1 MG/ML IJ SOLN: 0.2500 mg | INTRAMUSCULAR | Status: DC | PRN

## 2019-12-06 MED ORDER — LIDOCAINE HCL (CARDIAC) PF 100 MG/5ML IV SOSY
PREFILLED_SYRINGE | INTRAVENOUS | Status: DC | PRN
  Administered 2019-12-06: 20 mg via INTRAVENOUS

## 2019-12-06 MED ORDER — KETOROLAC TROMETHAMINE 15 MG/ML IJ SOLN
7.5000 mg | Freq: Four times a day (QID) | INTRAMUSCULAR | Status: DC
  Administered 2019-12-06: 7.5 mg via INTRAVENOUS

## 2019-12-06 MED ORDER — ACETAMINOPHEN 10 MG/ML IV SOLN
1000.0000 mg | INTRAVENOUS | Status: AC
  Administered 2019-12-06: 1000 mg via INTRAVENOUS
  Filled 2019-12-06: qty 100

## 2019-12-06 MED ORDER — PHENYLEPHRINE HCL-NACL 10-0.9 MG/250ML-% IV SOLN
INTRAVENOUS | Status: DC | PRN
Start: 2019-12-06 — End: 2019-12-06
  Administered 2019-12-06: 25 ug/min via INTRAVENOUS

## 2019-12-06 MED ORDER — LACTATED RINGERS IV BOLUS
250.0000 mL | Freq: Once | INTRAVENOUS | Status: AC
  Administered 2019-12-06: 250 mL via INTRAVENOUS

## 2019-12-06 MED ORDER — OXYCODONE HCL 5 MG PO TABS
ORAL_TABLET | ORAL | Status: AC
  Filled 2019-12-06: qty 2

## 2019-12-06 MED ORDER — METHOCARBAMOL 500 MG PO TABS: 500.0000 mg | ORAL_TABLET | Freq: Four times a day (QID) | ORAL | Status: DC | PRN

## 2019-12-06 MED ORDER — OXYCODONE HCL 5 MG PO TABS: 5.0000 mg | ORAL_TABLET | ORAL | 0 refills | Status: DC | PRN

## 2019-12-06 MED ORDER — STERILE WATER FOR IRRIGATION IR SOLN
Status: DC | PRN
  Administered 2019-12-06: 2000 mL

## 2019-12-06 MED ORDER — OXYCODONE HCL 5 MG PO TABS: 10.0000 mg | ORAL_TABLET | ORAL | Status: DC | PRN

## 2019-12-06 MED ORDER — VANCOMYCIN HCL IN DEXTROSE 1-5 GM/200ML-% IV SOLN: 1000.0000 mg | Freq: Two times a day (BID) | INTRAVENOUS | Status: DC

## 2019-12-06 MED ORDER — VANCOMYCIN HCL IN DEXTROSE 1-5 GM/200ML-% IV SOLN
1000.0000 mg | INTRAVENOUS | Status: AC
  Administered 2019-12-06: 1000 mg via INTRAVENOUS
  Filled 2019-12-06: qty 200

## 2019-12-06 SURGICAL SUPPLY — 68 items
BAG ZIPLOCK 12X15 (MISCELLANEOUS) IMPLANT
BASEPLATE TIBIAL SZ2 TRI (Joint) ×1 IMPLANT
BATTERY INSTRU NAVIGATION (MISCELLANEOUS) ×9 IMPLANT
BLADE SAW RECIPROCATING 77.5 (BLADE) ×3 IMPLANT
BNDG ELASTIC 4X5.8 VLCR STR LF (GAUZE/BANDAGES/DRESSINGS) ×3 IMPLANT
BNDG ELASTIC 6X5.8 VLCR STR LF (GAUZE/BANDAGES/DRESSINGS) ×3 IMPLANT
CHLORAPREP W/TINT 26 (MISCELLANEOUS) ×6 IMPLANT
COMP FEMORAL CEMNTLESS SZ2 TRI (Joint) ×3 IMPLANT
COMPONENT FEMRL CMNTLS SZ2 TRI (Joint) ×1 IMPLANT
COVER SURGICAL LIGHT HANDLE (MISCELLANEOUS) ×3 IMPLANT
COVER WAND RF STERILE (DRAPES) IMPLANT
CUFF TOURN SGL QUICK 34 (TOURNIQUET CUFF) ×3
CUFF TRNQT CYL 34X4.125X (TOURNIQUET CUFF) ×1 IMPLANT
DECANTER SPIKE VIAL GLASS SM (MISCELLANEOUS) ×6 IMPLANT
DERMABOND ADVANCED (GAUZE/BANDAGES/DRESSINGS) ×4
DERMABOND ADVANCED .7 DNX12 (GAUZE/BANDAGES/DRESSINGS) ×2 IMPLANT
DRAPE SHEET LG 3/4 BI-LAMINATE (DRAPES) ×9 IMPLANT
DRAPE U-SHAPE 47X51 STRL (DRAPES) ×3 IMPLANT
DRSG AQUACEL AG ADV 3.5X10 (GAUZE/BANDAGES/DRESSINGS) ×3 IMPLANT
DRSG TEGADERM 4X4.75 (GAUZE/BANDAGES/DRESSINGS) IMPLANT
ELECT BLADE TIP CTD 4 INCH (ELECTRODE) ×3 IMPLANT
ELECT REM PT RETURN 15FT ADLT (MISCELLANEOUS) ×3 IMPLANT
EVACUATOR 1/8 PVC DRAIN (DRAIN) IMPLANT
GAUZE SPONGE 4X4 12PLY STRL (GAUZE/BANDAGES/DRESSINGS) ×3 IMPLANT
GLOVE BIO SURGEON STRL SZ8.5 (GLOVE) ×6 IMPLANT
GLOVE BIOGEL PI IND STRL 8.5 (GLOVE) ×1 IMPLANT
GLOVE BIOGEL PI INDICATOR 8.5 (GLOVE) ×2
GOWN SPEC L3 XXLG W/TWL (GOWN DISPOSABLE) ×3 IMPLANT
HANDPIECE INTERPULSE COAX TIP (DISPOSABLE) ×3
HOLDER FOLEY CATH W/STRAP (MISCELLANEOUS) ×3 IMPLANT
HOOD PEEL AWAY FLYTE STAYCOOL (MISCELLANEOUS) ×9 IMPLANT
INSERT TIB BEAR SZ 2 9 KNEE (Miscellaneous) ×3 IMPLANT
JET LAVAGE IRRISEPT WOUND (IRRIGATION / IRRIGATOR) ×3
KIT TURNOVER KIT A (KITS) IMPLANT
KNEE PATELLA ASYMMETRIC 10X32 (Knees) ×3 IMPLANT
LAVAGE JET IRRISEPT WOUND (IRRIGATION / IRRIGATOR) ×1 IMPLANT
MARKER SKIN DUAL TIP RULER LAB (MISCELLANEOUS) ×3 IMPLANT
NDL SAFETY ECLIPSE 18X1.5 (NEEDLE) ×1 IMPLANT
NEEDLE HYPO 18GX1.5 SHARP (NEEDLE) ×3
NEEDLE SPNL 18GX3.5 QUINCKE PK (NEEDLE) ×3 IMPLANT
NS IRRIG 1000ML POUR BTL (IV SOLUTION) ×3 IMPLANT
PACK TOTAL KNEE CUSTOM (KITS) ×3 IMPLANT
PADDING CAST COTTON 6X4 STRL (CAST SUPPLIES) ×3 IMPLANT
PENCIL SMOKE EVACUATOR (MISCELLANEOUS) IMPLANT
PIN FLUTED HEDLESS FIX 3.5X1/8 (PIN) ×3 IMPLANT
PROTECTOR NERVE ULNAR (MISCELLANEOUS) ×3 IMPLANT
SAW OSC TIP CART 19.5X105X1.3 (SAW) ×3 IMPLANT
SEALER BIPOLAR AQUA 6.0 (INSTRUMENTS) ×3 IMPLANT
SET HNDPC FAN SPRY TIP SCT (DISPOSABLE) ×1 IMPLANT
SET PAD KNEE POSITIONER (MISCELLANEOUS) ×3 IMPLANT
SPONGE DRAIN TRACH 4X4 STRL 2S (GAUZE/BANDAGES/DRESSINGS) IMPLANT
SUT MNCRL AB 3-0 PS2 18 (SUTURE) ×3 IMPLANT
SUT MNCRL AB 4-0 PS2 18 (SUTURE) ×3 IMPLANT
SUT MON AB 2-0 CT1 36 (SUTURE) ×3 IMPLANT
SUT STRATAFIX PDO 1 14 VIOLET (SUTURE) ×3
SUT STRATFX PDO 1 14 VIOLET (SUTURE) ×1
SUT VIC AB 1 CTX 36 (SUTURE) ×6
SUT VIC AB 1 CTX36XBRD ANBCTR (SUTURE) ×2 IMPLANT
SUT VIC AB 2-0 CT1 27 (SUTURE) ×3
SUT VIC AB 2-0 CT1 TAPERPNT 27 (SUTURE) ×1 IMPLANT
SUTURE STRATFX PDO 1 14 VIOLET (SUTURE) ×1 IMPLANT
SYR 3ML LL SCALE MARK (SYRINGE) ×3 IMPLANT
TIBIAL BASEPLATE SZ2 TRI (Joint) ×3 IMPLANT
TOWER CARTRIDGE SMART MIX (DISPOSABLE) IMPLANT
TRAY FOLEY MTR SLVR 16FR STAT (SET/KITS/TRAYS/PACK) IMPLANT
WATER STERILE IRR 1000ML POUR (IV SOLUTION) ×6 IMPLANT
WRAP KNEE MAXI GEL POST OP (GAUZE/BANDAGES/DRESSINGS) ×3 IMPLANT
YANKAUER SUCT BULB TIP 10FT TU (MISCELLANEOUS) ×3 IMPLANT

## 2019-12-06 NOTE — Progress Notes (Signed)
Orthopedic Tech Progress Note Patient Details:  [name] [name] 1945/09/06 [id number]  Ortho Devices Type of Ortho Device: Knee Immobilizer Ortho Device/Splint Interventions: Application   Post Interventions Patient Tolerated: Well Instructions Provided: Care of device   [place] Pink 12/06/2019, 2:39 PM

## 2019-12-06 NOTE — Interval H&P Note (Signed)
History and Physical Interval Note:  12/06/2019 7:30 AM  [name] [name]  has presented today for surgery, with the diagnosis of Degenerative joint disease left knee.  The various methods of treatment have been discussed with the patient and family. After consideration of risks, benefits and other options for treatment, the patient has consented to  Procedure(s): COMPUTER ASSISTED TOTAL KNEE ARTHROPLASTY (Left) as a surgical intervention.  The patient's history has been reviewed, patient examined, no change in status, stable for surgery.  I have reviewed the patient's chart and labs.  Questions were answered to the patient's satisfaction.     Hilton Cork Cordaro Mukai

## 2019-12-06 NOTE — Op Note (Signed)
OPERATIVE REPORT  SURGEON: Rod Can, MD   ASSISTANT: Cherlynn June, PA-C Nehemiah Massed, PA-C  PREOPERATIVE DIAGNOSIS: Left knee arthritis.   POSTOPERATIVE DIAGNOSIS: Left knee arthritis.   PROCEDURE: Left total knee arthroplasty.   IMPLANTS: Stryker Triathlon CR femur, size 2. Stryker Tritanium tibia, size 2. X3 polyethelyene insert, size 9 mm, CR. 3 button asymmetric patella, size 32 mm.  ANESTHESIA:  MAC, Regional and Spinal  TOURNIQUET TIME: Not utilized.   ESTIMATED BLOOD LOSS:-300 mL    ANTIBIOTICS: 1 g Vancomycin.  DRAINS: None.  COMPLICATIONS: None   CONDITION: PACU - hemodynamically stable.   BRIEF CLINICAL NOTE: [name] [name] is a 74 y.o. female with a long-standing history of Left knee arthritis. After failing conservative management, the patient was indicated for total knee arthroplasty. The risks, benefits, and alternatives to the procedure were explained, and the patient elected to proceed.  PROCEDURE IN DETAIL: Adductor canal block was obtained in the pre-op holding area. Once inside the operative room, spinal anesthesia was obtained, and a foley catheter was inserted. The patient was then positioned, a nonsterile tourniquet was placed, and the lower extremity was prepped and draped in the normal sterile surgical fashion.  A time-out was called verifying side and site of surgery. The patient received IV antibiotics within 60 minutes of beginning the procedure. The tourniquet was not utilized.   An anterior approach to the knee was performed utilizing a midvastus arthrotomy. A medial release was performed and the patellar fat pad was excised. Stryker navigation was used to cut the distal femur perpendicular to the mechanical axis. A freehand patellar resection was performed, and the patella was sized an prepared with 3 lug holes.  Nagivation was used to  make a neutral proximal tibia resection, taking 6 mm of bone from the less affected medial side with 3 degrees of slope. The menisci were excised. A spacer block was placed, and the alignment and balance in extension were confirmed.   The distal femur was sized using the 3-degree external rotation guide referencing the posterior femoral cortex. The appropriate 4-in-1 cutting block was pinned into place. Rotation was checked using Whiteside's line, the epicondylar axis, and then confirmed with a spacer block in flexion. The remaining femoral cuts were performed, taking care to protect the MCL.  The tibia was sized and the trial tray was pinned into place. The remaining trail components were inserted. The knee was stable to varus and valgus stress through a full range of motion. The patella tracked centrally, and the PCL was well balanced. The trial components were removed, and the proximal tibial surface was prepared. Final components were impacted into place. The knee was tested for a final time and found to be well balanced.   The wound was copiously irrigated with Irrisept solution and normal saline using pule lavage.  Marcaine solution was injected into the periarticular soft tissue.  The wound was closed in layers using #1 Vicryl and Stratafix for the fascia, 2-0 Vicryl for the subcutaneous fat, 2-0 Monocryl for the deep dermal layer, 3-0 running Monocryl subcuticular Stitch, and 4-0 Monocryl stay sutures at both ends of the wound. Dermabond was applied to the skin.  Once the glue was fully dried, an Aquacell Ag and compressive dressing were applied.  Tthe patient was transported to the recovery room in stable condition.  Sponge, needle, and instrument counts were correct at the end of the case x2.  The patient tolerated the procedure well and there were no known complications.  Please note that a surgical assistant was a medical necessity for this procedure in order to perform it in a safe and  expeditious manner. Surgical assistant was necessary to retract the ligaments and vital neurovascular structures to prevent injury to them and also necessary for proper positioning of the limb to allow for anatomic placement of the prosthesis.

## 2019-12-06 NOTE — Anesthesia Postprocedure Evaluation (Signed)
Anesthesia Post Note  Patient: [name] [name]  Procedure(s) Performed: COMPUTER ASSISTED TOTAL KNEE ARTHROPLASTY (Left Knee)     Patient location during evaluation: PACU Anesthesia Type: Spinal Level of consciousness: oriented and awake and alert Pain management: pain level controlled Vital Signs Assessment: post-procedure vital signs reviewed and stable Respiratory status: spontaneous breathing, respiratory function stable and patient connected to nasal cannula oxygen Cardiovascular status: blood pressure returned to baseline and stable Postop Assessment: no headache, no backache and no apparent nausea or vomiting Anesthetic complications: no   No complications documented.  Last Vitals:  Vitals:   12/06/19 1209 12/06/19 1300  BP: (!) 148/67 (!) 141/57  Pulse: (!) 52 68  Resp: 16 12  Temp: 36.7 C   SpO2: 95% 94%    Last Pain:  Vitals:   12/06/19 1431  TempSrc:   PainSc: 7                  Vanna Shavers S

## 2019-12-06 NOTE — Transfer of Care (Signed)
Immediate Anesthesia Transfer of Care Note  Patient: [name] [name]  Procedure(s) Performed: COMPUTER ASSISTED TOTAL KNEE ARTHROPLASTY (Left Knee)  Patient Location: PACU  Anesthesia Type:Spinal  Level of Consciousness: awake, alert , oriented, drowsy and patient cooperative  Airway & Oxygen Therapy: Patient Spontanous Breathing and Patient connected to face mask oxygen  Post-op Assessment: Report given to RN and Post -op Vital signs reviewed and stable  Post vital signs: Reviewed and stable  Last Vitals:  Vitals Value Taken Time  BP 97/60 12/06/19 1022  Temp    Pulse 64 12/06/19 1023  Resp 18 12/06/19 1023  SpO2 94 % 12/06/19 1023  Vitals shown include unvalidated device data.  Last Pain:  Vitals:   12/06/19 0635  TempSrc:   PainSc: 0-No pain         Complications: No complications documented.

## 2019-12-06 NOTE — Anesthesia Procedure Notes (Signed)
Spinal  Patient location during procedure: OR Start time: 12/06/2019 7:41 AM End time: 12/06/2019 7:46 AM Staffing Anesthesiologist: Myrtie Soman, MD Preanesthetic Checklist Completed: patient identified, IV checked, site marked, risks and benefits discussed, surgical consent, monitors and equipment checked, pre-op evaluation and timeout performed Spinal Block Patient position: sitting Prep: DuraPrep Patient monitoring: heart rate, cardiac monitor, continuous pulse ox and blood pressure Approach: midline Location: L3-4 Injection technique: single-shot Needle Needle type: Sprotte  Needle gauge: 24 G Needle length: 9 cm Assessment Sensory level: T6

## 2019-12-06 NOTE — Discharge Instructions (Signed)
° °Dr. Aeron Lheureux °Total Joint Specialist °[facility] °[address] °[place], [place] [address] °[phone] ° °TOTAL KNEE REPLACEMENT POSTOPERATIVE DIRECTIONS ° ° ° °Knee Rehabilitation, Guidelines Following Surgery  °Results after knee surgery are often greatly improved when you follow the exercise, range of motion and muscle strengthening exercises prescribed by your doctor. Safety measures are also important to protect the knee from further injury. Any time any of these exercises cause you to have increased pain or swelling in your knee joint, decrease the amount until you are comfortable again and slowly increase them. If you have problems or questions, call your caregiver or physical therapist for advice.  ° °WEIGHT BEARING °Weight bearing as tolerated with assist device (walker, cane, etc) as directed, use it as long as suggested by your surgeon or therapist, typically at least 4-6 weeks. ° °HOME CARE INSTRUCTIONS  °Remove items at home which could result in a fall. This includes throw rugs or furniture in walking pathways.  °Continue medications as instructed at time of discharge. °You may have some home medications which will be placed on hold until you complete the course of blood thinner medication.  °You may start showering once you are discharged home but do not submerge the incision under water. Just pat the incision dry and apply a dry gauze dressing on daily. °Walk with walker as instructed.  °You may resume a sexual relationship in one month or when given the OK by your doctor.  °· Use walker as long as suggested by your caregivers. °· Avoid periods of inactivity such as sitting longer than an hour when not asleep. This helps prevent blood clots.  °You may put full weight on your legs and walk as much as is comfortable.  °You may return to work once you are cleared by your doctor.  °Do not drive a car for 6 weeks or until released by you surgeon.  °· Do not drive  while taking narcotics.  °Wear the elastic stockings for three weeks following surgery during the day but you may remove then at night. °Make sure you keep all of your appointments after your operation with all of your doctors and caregivers. You should call the office at the above phone number and make an appointment for approximately two weeks after the date of your surgery. °Do not remove your surgical dressing. The dressing is waterproof; you may take showers in 3 days, but do not take tub baths or submerge the dressing. °Please pick up a stool softener and laxative for home use as long as you are requiring pain medications. °· ICE to the affected knee every three hours for 30 minutes at a time and then as needed for pain and swelling.  Continue to use ice on the knee for pain and swelling from surgery. You may notice swelling that will progress down to the foot and ankle.  This is normal after surgery.  Elevate the leg when you are not up walking on it.   °It is important for you to complete the blood thinner medication as prescribed by your doctor. °· Continue to use the breathing machine which will help keep your temperature down.  It is common for your temperature to cycle up and down following surgery, especially at night when you are not up moving around and exerting yourself.  The breathing machine keeps your lungs expanded and your temperature down. ° °RANGE OF MOTION AND STRENGTHENING EXERCISES  °Rehabilitation of the knee is important following   a knee injury or an operation. After just a few days of immobilization, the muscles of the thigh which control the knee become weakened and shrink (atrophy). Knee exercises are designed to build up the tone and strength of the thigh muscles and to improve knee motion. Often times heat used for twenty to thirty minutes before working out will loosen up your tissues and help with improving the range of motion but do not use heat for the first two weeks following  surgery. These exercises can be done on a training (exercise) mat, on the floor, on a table or on a bed. Use what ever works the best and is most comfortable for you Knee exercises include:  °Leg Lifts - While your knee is still immobilized in a splint or cast, you can do straight leg raises. Lift the leg to 60 degrees, hold for 3 sec, and slowly lower the leg. Repeat 10-20 times 2-3 times daily. Perform this exercise against resistance later as your knee gets better.  °Quad and Hamstring Sets - Tighten up the muscle on the front of the thigh (Quad) and hold for 5-10 sec. Repeat this 10-20 times hourly. Hamstring sets are done by pushing the foot backward against an object and holding for 5-10 sec. Repeat as with quad sets.  °A rehabilitation program following serious knee injuries can speed recovery and prevent re-injury in the future due to weakened muscles. Contact your doctor or a physical therapist for more information on knee rehabilitation.  ° °SKILLED REHAB INSTRUCTIONS: °If the patient is transferred to a skilled rehab facility following release from the hospital, a list of the current medications will be sent to the facility for the patient to continue.  When discharged from the skilled rehab facility, please have the facility set up the patient's Home Health Physical Therapy prior to being released. Also, the skilled facility will be responsible for providing the patient with their medications at time of release from the facility to include their pain medication, the muscle relaxants, and their blood thinner medication. If the patient is still at the rehab facility at time of the two week follow up appointment, the skilled rehab facility will also need to assist the patient in arranging follow up appointment in our office and any transportation needs. ° °MAKE SURE YOU:  °Understand these instructions.  °Will watch your condition.  °Will get help right away if you are not doing well or get worse.   ° ° °Pick up stool softner and laxative for home use following surgery while on pain medications. °Do NOT remove your dressing. You may shower.  °Do not take tub baths or submerge incision under water. °May shower starting three days after surgery. °Please use a clean towel to pat the incision dry following showers. °Continue to use ice for pain and swelling after surgery. °Do not use any lotions or creams on the incision until instructed by your surgeon. ° °

## 2019-12-06 NOTE — Anesthesia Preprocedure Evaluation (Signed)
Anesthesia Evaluation  Patient identified by MRN, date of birth, ID band Patient awake    Reviewed: Allergy & Precautions, NPO status , Patient's Chart, lab work & pertinent test results  Airway Mallampati: II  TM Distance: >3 FB Neck ROM: Full    Dental no notable dental hx.    Pulmonary neg pulmonary ROS,    Pulmonary exam normal breath sounds clear to auscultation       Cardiovascular hypertension, negative cardio ROS Normal cardiovascular exam Rhythm:Regular Rate:Normal     Neuro/Psych negative neurological ROS  negative psych ROS   GI/Hepatic Neg liver ROS, GERD  ,  Endo/Other  negative endocrine ROSdiabetes  Renal/GU negative Renal ROS  negative genitourinary   Musculoskeletal negative musculoskeletal ROS (+)   Abdominal   Peds negative pediatric ROS (+)  Hematology negative hematology ROS (+)   Anesthesia Other Findings   Reproductive/Obstetrics negative OB ROS                             Anesthesia Physical Anesthesia Plan  ASA: II  Anesthesia Plan: Spinal   Post-op Pain Management:    Induction: Intravenous  PONV Risk Score and Plan: 2 and Ondansetron, Dexamethasone and Treatment may vary due to age or medical condition  Airway Management Planned: Simple Face Mask  Additional Equipment:   Intra-op Plan:   Post-operative Plan:   Informed Consent: I have reviewed the patients History and Physical, chart, labs and discussed the procedure including the risks, benefits and alternatives for the proposed anesthesia with the patient or authorized representative who has indicated his/her understanding and acceptance.     Dental advisory given  Plan Discussed with: CRNA and Surgeon  Anesthesia Plan Comments:         Anesthesia Quick Evaluation

## 2019-12-06 NOTE — Anesthesia Procedure Notes (Signed)
Anesthesia Procedure Image    

## 2019-12-06 NOTE — Anesthesia Procedure Notes (Signed)
Anesthesia Regional Block: Adductor canal block   Pre-Anesthetic Checklist: ,, timeout performed, Correct Patient, Correct Site, Correct Laterality, Correct Procedure, Correct Position, site marked, Risks and benefits discussed,  Surgical consent,  Pre-op evaluation,  At surgeon's request and post-op pain management  Laterality: Left  Prep: chloraprep       Needles:  Injection technique: Single-shot  Needle Type: Echogenic Needle     Needle Length: 9cm      Additional Needles:   Procedures:,,,, ultrasound used (permanent image in chart),,,,  Narrative:  Start time: 12/06/2019 7:03 AM End time: 12/06/2019 7:10 AM Injection made incrementally with aspirations every 5 mL.  Performed by: Personally  Anesthesiologist: Myrtie Soman, MD  Additional Notes: Patient tolerated the procedure well without complications

## 2019-12-06 NOTE — Evaluation (Signed)
Physical Therapy Evaluation Patient Details Name: [name] [name] MRN: [id number] DOB: [birth year] Today's Date: 12/06/2019   History of Present Illness  Patient is 74 y.o. female s/p Lt TKA on 12/06/19 with PMH significant for HLD, HTN, GERD, DM, pancreatitis, anxiety, Rt RCR in '95, L4-5 laminectomy in 2014.    Clinical Impression  [name] [name] is a 74 y.o. female POD 0 s/p Lt TKA. Patient reports independence with mobility at baseline. Patient is now limited by functional impairments (see PT problem list below) and requires min guard for transfers and gait with RW. Patient was able to ambulate ~80 feet with RW and min guard and cues for safe walker management. Patient's daughter present and provided safe guarding during transfers and gait. Patient educated on safe sequencing for stair mobility and daughter provided safe guarding position/assist with mobility. Patient instructed in exercises to facilitate ROM and circulation. Patient educated on pain management with use of ice and pt/daughter educated on use of immobilizer to safely enter home. Reviewed 3x that pt should remove immobilizer when she is safely in house and does not need to wear it again after that. Patient will benefit from continued skilled PT interventions to address impairments and progress towards PLOF. Patient has met mobility goals at adequate level for discharge home; will continue to follow if pt continues acute stay to progress towards Mod I goals.     Follow Up Recommendations Follow surgeon's recommendation for DC plan and follow-up therapies;Outpatient PT    Equipment Recommendations  Rolling walker with 5" wheels (youth)    Recommendations for Other Services       Precautions / Restrictions Precautions Precautions: Fall Restrictions Weight Bearing Restrictions: No Other Position/Activity Restrictions: WBAT      Mobility  Bed Mobility Overal bed mobility: Needs Assistance Bed Mobility: Supine to Sit      Supine to sit: Supervision     General bed mobility comments: pt requires extra time to raise up and bring LE's off EOB. no assist required.  Transfers Overall transfer level: Needs assistance Equipment used: Rolling walker (2 wheeled) Transfers: Sit to/from Stand Sit to Stand: Min guard;Supervision         General transfer comment: cues for safe technique with RW, no assist required to rise from EOB. Cues to use grab bar and min guard to steady with rise from toilet. pt's daughter present and provided safe guarding for sit<>stands.   Ambulation/Gait Ambulation/Gait assistance: Supervision;Min guard Gait Distance (Feet): 80 Feet Assistive device: Rolling walker (2 wheeled) Gait Pattern/deviations: Step-to pattern;Decreased stride length Gait velocity: decreased   General Gait Details: cues for safe proximityt to RW and safe step pattern within walker. No overt LOB noted, pt with antalgic pattern during gait. Pt's daughter present and educated on safe guarding technique and provided during session.   Stairs Stairs: Yes Stairs assistance: Min guard;Min assist Stair Management: No rails;Step to pattern;Backwards;With walker Number of Stairs: 2 General stair comments: verbal cues for safe sequencing of step pattern "up with good, down with bad" and safe walker positioning. pt's duaghter assisting with RW management and PT provided min guard and VC's. recomend knee immobilizer for stability on stairs with return home.  Wheelchair Mobility    Modified Rankin (Stroke Patients Only)       Balance Overall balance assessment: Needs assistance Sitting-balance support: Feet supported Sitting balance-Leahy Scale: Good     Standing balance support: During functional activity;Bilateral upper extremity supported Standing balance-Leahy Scale: Fair  Pertinent Vitals/Pain Pain Assessment: 0-10 Pain Score: 7  Pain Location: Lt knee Pain Descriptors / Indicators:  Aching;Crying;Discomfort Pain Intervention(s): Limited activity within patient's tolerance;Monitored during session;Repositioned    Home Living Family/patient expects to be discharged to:: Private residence Living Arrangements: Children;Other relatives Available Help at Discharge: Family Type of Home: House Home Access: Stairs to enter Entrance Stairs-Rails: None Entrance Stairs-Number of Steps: 2 Home Layout: One level;Full bath on main level Home Equipment: Walker - 2 wheels;Bedside commode;Cane - single point;Shower seat Additional Comments: pt's daughter will be off for 1 week and helping her at home. pt also has two grandsons there to help.    Prior Function Level of Independence: Independent               Hand Dominance   Dominant Hand: Right    Extremity/Trunk Assessment   Upper Extremity Assessment Upper Extremity Assessment: Overall WFL for tasks assessed    Lower Extremity Assessment Lower Extremity Assessment: LLE deficits/detail LLE Deficits / Details: good quad activation, pt able to complete SLR LLE Sensation: WNL LLE Coordination: WNL    Cervical / Trunk Assessment Cervical / Trunk Assessment: Normal  Communication   Communication: No difficulties  Cognition Arousal/Alertness: Awake/alert Behavior During Therapy: WFL for tasks assessed/performed Overall Cognitive Status: Within Functional Limits for tasks assessed           General Comments      Exercises Total Joint Exercises Ankle Circles/Pumps: AROM;Both;20 reps;Seated Quad Sets: AROM;Left;Other reps (comment);Seated (3) Towel Squeeze: AROM;Both;Other reps (comment);Seated (2) Short Arc Quad: AROM;Left;Other reps (comment);Seated (2) Heel Slides: AROM;Left;Other reps (comment);Seated (3) Hip ABduction/ADduction: AROM;Left;Other reps (comment);Seated (3) Straight Leg Raises: AROM;Right;Other reps (comment);Seated (1 for demonstration) Long Arc Quad: AROM;Left;Other reps (comment);Seated  (2) Knee Flexion: AROM;AAROM;Left;Other reps (comment);Seated (2)   Assessment/Plan    PT Assessment Patient needs continued PT services  PT Problem List Decreased strength;Decreased range of motion;Decreased activity tolerance;Decreased balance;Decreased mobility;Decreased knowledge of use of DME;Decreased knowledge of precautions;Pain       PT Treatment Interventions DME instruction;Gait training;Functional mobility training;Stair training;Therapeutic activities;Therapeutic exercise;Balance training;Patient/family education    PT Goals (Current goals can be found in the Care Plan section)  Acute Rehab PT Goals Patient Stated Goal: to go home today PT Goal Formulation: With patient Time For Goal Achievement: 12/13/19 Potential to Achieve Goals: Good    Frequency 7X/week    AM-PAC PT "6 Clicks" Mobility  Outcome Measure Help needed turning from your back to your side while in a flat bed without using bedrails?: None Help needed moving from lying on your back to sitting on the side of a flat bed without using bedrails?: None Help needed moving to and from a bed to a chair (including a wheelchair)?: A Little Help needed standing up from a chair using your arms (e.g., wheelchair or bedside chair)?: A Little Help needed to walk in hospital room?: A Little Help needed climbing 3-5 steps with a railing? : A Little 6 Click Score: 20    End of Session Equipment Utilized During Treatment: Gait belt;Left knee immobilizer (knee immobilizer to go home) Activity Tolerance: Patient tolerated treatment well Patient left: in chair;with family/visitor present Nurse Communication: Mobility status;Other (comment) (needs youth walker and knee immobilizer) PT Visit Diagnosis: Muscle weakness (generalized) (M62.81);Difficulty in walking, not elsewhere classified (R26.2);Pain Pain - Right/Left: Left Pain - part of body: Knee    Time: 1349-1450 PT Time Calculation (min) (ACUTE ONLY): 61  min   Charges:   PT Evaluation $PT Eval Low  Complexity: 1 Low PT Treatments $Gait Training: 23-37 mins $Therapeutic Exercise: 8-22 mins        Verner Mould, DPT Acute Rehabilitation Services  Office [phone] Pager [phone]  12/06/2019 3:26 PM

## 2019-12-07 ENCOUNTER — Encounter (HOSPITAL_COMMUNITY): Payer: Self-pay | Admitting: Orthopedic Surgery

## 2019-12-12 ENCOUNTER — Other Ambulatory Visit (HOSPITAL_BASED_OUTPATIENT_CLINIC_OR_DEPARTMENT_OTHER): Payer: Self-pay | Admitting: Orthopedic Surgery

## 2019-12-12 ENCOUNTER — Other Ambulatory Visit: Payer: Self-pay

## 2019-12-12 ENCOUNTER — Ambulatory Visit (HOSPITAL_BASED_OUTPATIENT_CLINIC_OR_DEPARTMENT_OTHER)
Admission: RE | Admit: 2019-12-12 | Discharge: 2019-12-12 | Disposition: A | Discharge status: Home / Self Care | Payer: Medicare Other | Source: Ambulatory Visit | Attending: Orthopedic Surgery | Admitting: Orthopedic Surgery

## 2019-12-12 DIAGNOSIS — R609 Edema, unspecified: Secondary | ICD-10-CM | POA: Diagnosis present

## 2019-12-12 DIAGNOSIS — R52 Pain, unspecified: Secondary | ICD-10-CM | POA: Insufficient documentation

## 2020-01-20 NOTE — Progress Notes (Deleted)
Cardiology Office Note:    Date:  01/20/2020   ID:  [name], [name] 07/29/1945, MRN [id number]  PCP:  Crist Infante, MD  Cardiologist:  Donato Heinz, MD  Electrophysiologist:  None   Referring MD: Crist Infante, MD   No chief complaint on file.   History of Present Illness:    [name] [name] is a 74 y.o. female with a hx of hypothyroidism, GERD, type 2 diabetes, hypertension, obesity, pancreatitis who presents for follow-up.  She was referred by Dr. Joylene Draft for preop evaluation prior to knee surgery, initially seen on 10/27/2019.  She reports that she walks for 45 minutes 3 times per week.  She denies any chest pain or dyspnea.  Exertion is primarily limited by her knee pain.  States that she can walk up a flight of stairs without stopping.  She does report she has been having lower extremity edema, primarily in the left leg.  Most recent hemoglobin A1c 7.3.  She takes benazepril 20 mg daily and atenolol 50 mg daily for hypertension.  Reports BP 110-130s at home.  No smoking history.  Family history includes father died during CABG in 50s, brother had CABG in 25s, mother had CABG in 59s.    She was noted at initial clinic visit to have asymmetric lower extremity edema and lower extremity duplex on 11/01/2019 showed no DVT.  Echocardiogram on 11/20/2019 showed normal biventricular function, no significant valvular disease.  Underwent left total knee arthroplasty on 12/06/2019  Since last clinic visit,  Past Medical History:  Diagnosis Date  . Anxiety    occ  . DDD (degenerative disc disease)   . DJD (degenerative joint disease)   . DM type 2 (diabetes mellitus, type 2) ([place])   . GERD (gastroesophageal reflux disease)   . Hyperlipidemia   . Hypertension   . Hypothyroidism   . Pancreatitis    h/o, twice    Past Surgical History:  Procedure Laterality Date  . BREAST CYST EXCISION     left, benign   . COLONOSCOPY    . KNEE ARTHROPLASTY Left 12/06/2019   Procedure:  COMPUTER ASSISTED TOTAL KNEE ARTHROPLASTY;  Surgeon: Rod Can, MD;  Location: [facility];  Service: Orthopedics;  Laterality: Left;  . LUMBAR LAMINECTOMY N/A 04/21/2013   Procedure: CENTRAL MICRODISCECTOMY AND COMPLETE DECOMPRESSION OF LUMBAR LAMINECTOMY L4-L5 ;  Surgeon: Tobi Bastos, MD;  Location: [facility];  Service: Orthopedics;  Laterality: N/A;  . POLYPECTOMY    . [place]   right  . TOTAL ABDOMINAL HYSTERECTOMY  1984    Current Medications: No outpatient medications have been marked as taking for the 01/26/20 encounter (Appointment) with Donato Heinz, MD.     Allergies:   Codeine, Hydrocodone, Penicillins, and Sulfa antibiotics   Social History   Socioeconomic History  . Marital status: Widowed    Spouse name: Not on file  . Number of children: 1  . Years of education: 61  . Highest education level: Not on file  Occupational History  . Not on file  Tobacco Use  . Smoking status: Never Smoker  . Smokeless tobacco: Never Used  Vaping Use  . Vaping Use: Never used  Substance and Sexual Activity  . Alcohol use: No  . Drug use: No  . Sexual activity: Not on file  Other Topics Concern  . Not on file  Social History Narrative  . Not on file   Social Determinants of Health   Financial Resource  Strain:   . Difficulty of Paying Living Expenses:   Food Insecurity:   . Worried About Charity fundraiser in the Last Year:   . Arboriculturist in the Last Year:   Transportation Needs:   . Film/video editor (Medical):   Marland Kitchen Lack of Transportation (Non-Medical):   Physical Activity:   . Days of Exercise per Week:   . Minutes of Exercise per Session:   Stress:   . Feeling of Stress :   Social Connections:   . Frequency of Communication with Friends and Family:   . Frequency of Social Gatherings with Friends and Family:   . Attends Religious Services:   . Active Member of Clubs or Organizations:   . Attends Archivist  Meetings:   Marland Kitchen Marital Status:      Family History: The patient's family history includes Dementia in her mother; Fibromyalgia in her sister; Heart disease in her brother and father; Hypertension in her brother. There is no history of Colon cancer, Colon polyps, Esophageal cancer, Rectal cancer, or Stomach cancer.  ROS:   Please see the history of present illness.     All other systems reviewed and are negative.  EKGs/Labs/Other Studies Reviewed:    The following studies were reviewed today:   EKG:  EKG is ordered today.  The ekg ordered today demonstrates normal sinus rhythm, rate 60, borderline LVH, poor R wave progression  Recent Labs: 11/27/2019: ALT 16; BUN 14; Creatinine, Ser 0.64; Hemoglobin 15.0; Platelets 274; Potassium 4.1; Sodium 139  Recent Lipid Panel    Component Value Date/Time   CHOL  11/24/2006 2155    164        ATP III CLASSIFICATION:  <200     mg/dL   Desirable  200-239  mg/dL   Borderline High  >=240    mg/dL   High   TRIG 150 (H) 11/24/2006 2155   HDL 48 11/24/2006 2155   CHOLHDL 3.4 11/24/2006 2155   VLDL 30 11/24/2006 2155   [place]  11/24/2006 2155    86        Total Cholesterol/HDL:CHD Risk Coronary Heart Disease Risk Table                     Men   Women  1/2 Average Risk   3.4   3.3    Physical Exam:    VS:  There were no vitals taken for this visit.    Wt Readings from Last 3 Encounters:  12/06/19 175 lb 7 oz (79.6 kg)  11/27/19 175 lb 7 oz (79.6 kg)  11/24/19 176 lb (79.8 kg)     GEN: in no acute distress HEENT: Normal NECK: No JVD; No carotid bruits CARDIAC: RRR, no murmurs, rubs, gallops RESPIRATORY:  Clear to auscultation without rales, wheezing or rhonchi  ABDOMEN: Soft, non-tender, non-distended MUSCULOSKELETAL:  No edema; No deformity  SKIN: Warm and dry NEUROLOGIC:  Alert and oriented x 3 PSYCHIATRIC:  Normal affect   ASSESSMENT:    No diagnosis found. PLAN:     Left lower extremity edema: Asymmetric.  Lower  extremity edema and lower extremity duplex on 11/01/2019 showed no DVT.  Echocardiogram on 11/20/2019 showed normal biventricular function, no significant valvular disease  Hypertension: On atenolol 50 mg daily and benazepril 20 mg daily.    Type 2 diabetes: A1c is 7.3.  She is on Farxiga and Metformin  RTC in ***   Medication Adjustments/Labs and Tests Ordered: Current  medicines are reviewed at length with the patient today.  Concerns regarding medicines are outlined above.  No orders of the defined types were placed in this encounter.  No orders of the defined types were placed in this encounter.   There are no Patient Instructions on file for this visit.   Signed, Donato Heinz, MD  01/20/2020 2:57 PM    [facility]

## 2020-01-26 ENCOUNTER — Ambulatory Visit: Payer: Medicare Other | Admitting: Cardiology

## 2020-02-05 ENCOUNTER — Other Ambulatory Visit: Payer: Self-pay | Admitting: Internal Medicine

## 2020-02-05 DIAGNOSIS — R200 Anesthesia of skin: Secondary | ICD-10-CM

## 2020-02-24 ENCOUNTER — Ambulatory Visit
Admission: RE | Admit: 2020-02-24 | Discharge: 2020-02-24 | Disposition: A | Discharge status: Home / Self Care | Payer: Medicare Other | Source: Ambulatory Visit | Attending: Internal Medicine | Admitting: Internal Medicine

## 2020-02-24 ENCOUNTER — Other Ambulatory Visit: Payer: Self-pay

## 2020-02-24 DIAGNOSIS — R200 Anesthesia of skin: Secondary | ICD-10-CM

## 2020-03-11 NOTE — Progress Notes (Signed)
Cardiology Office Note:    Date:  03/12/2020   ID:  Affie, Gasner 1945-07-03, MRN [id number]  PCP:  Crist Infante, MD  Cardiologist:  Donato Heinz, MD  Electrophysiologist:  None   Referring MD: Crist Infante, MD   Chief Complaint  Patient presents with  . Hypertension    History of Present Illness:    [name] [name] is a 74 y.o. female with a hx of hypothyroidism, GERD, type 2 diabetes, hypertension, obesity, pancreatitis who presents for follow-up.  She was referred by Dr. Joylene Draft for preop evaluation prior to knee surgery, initially seen on 10/27/2019.  She reports that she walks for 45 minutes 3 times per week.  She denies any chest pain or dyspnea.  Exertion is primarily limited by her knee pain.  States that she can walk up a flight of stairs without stopping.  She does report she has been having lower extremity edema, primarily in the left leg.  Most recent hemoglobin A1c 7.3.  She takes benazepril 20 mg daily and atenolol 50 mg daily for hypertension.  Reports BP 110-130s at home.  No smoking history.  Family history includes father died during CABG in 4s, brother had CABG in 40s, mother had CABG in 14s.    Lower extremity duplex on 11/01/2019 showed no evidence of DVT.  Echocardiogram on 11/20/2019 showed normal biventricular function, no significant valvular disease.  Since last clinic visit, she reports that she had her knee surgery on 6/30.  She is currently recovering, continues to do physical therapy.  She denies any chest pain, dyspnea, lightheadedness, or syncope.  Reports BP has been elevated at home, up to 150s.    Past Medical History:  Diagnosis Date  . Anxiety    occ  . DDD (degenerative disc disease)   . DJD (degenerative joint disease)   . DM type 2 (diabetes mellitus, type 2) ([place])   . GERD (gastroesophageal reflux disease)   . Hyperlipidemia   . Hypertension   . Hypothyroidism   . Pancreatitis    h/o, twice    Past Surgical History:    Procedure Laterality Date  . BREAST CYST EXCISION     left, benign   . COLONOSCOPY    . KNEE ARTHROPLASTY Left 12/06/2019   Procedure: COMPUTER ASSISTED TOTAL KNEE ARTHROPLASTY;  Surgeon: Rod Can, MD;  Location: [facility];  Service: Orthopedics;  Laterality: Left;  . LUMBAR LAMINECTOMY N/A 04/21/2013   Procedure: CENTRAL MICRODISCECTOMY AND COMPLETE DECOMPRESSION OF LUMBAR LAMINECTOMY L4-L5 ;  Surgeon: Tobi Bastos, MD;  Location: [facility];  Service: Orthopedics;  Laterality: N/A;  . POLYPECTOMY    . [place]   right  . TOTAL ABDOMINAL HYSTERECTOMY  1984    Current Medications: Current Meds  Medication Sig  . ascorbic acid (GNP VITAMIN C) 500 MG tablet Take 500 mg by mouth daily.  Marland Kitchen atenolol (TENORMIN) 50 MG tablet Take 1 tablet (50 mg total) by mouth at bedtime.  Marland Kitchen atorvastatin (LIPITOR) 10 MG tablet Take 10 mg by mouth daily.  . benazepril (LOTENSIN) 20 MG tablet Take 20 mg by mouth at bedtime.   . Calcium-Magnesium-Zinc (CAL-MAG-ZINC PO) Take 1 tablet by mouth daily.  . cholecalciferol (VITAMIN D) 25 MCG (1000 UNIT) tablet Take 1,000 Units by mouth daily.  . Cyanocobalamin (VITAMIN B-12) 2500 MCG SUBL Place 2,500 mcg under the tongue daily.  Marland Kitchen FARXIGA 10 MG TABS tablet Take 10 mg by mouth daily.  . furosemide (LASIX)  20 MG tablet Take 20 mg by mouth daily as needed (swelling/fluid retention.).   Marland Kitchen glucose blood test strip USE TO TEST BLOOD SUGAR ONCE DAILY  . levothyroxine (SYNTHROID, LEVOTHROID) 88 MCG tablet Take 88 mcg by mouth daily before breakfast.  . metFORMIN (GLUCOPHAGE-XR) 500 MG 24 hr tablet Take 500-1,000 mg by mouth See admin instructions. Take 2 tablets (1000 mg) by mouth in the morning & take 1 tablet (500 mg) by mouth at night.  . naproxen sodium (ALEVE) 220 MG tablet Take 220-440 mg by mouth 2 (two) times daily as needed (pain.).  Marland Kitchen omeprazole (PRILOSEC) 40 MG capsule Take 40 mg by mouth daily.   . ondansetron (ZOFRAN) 4 MG tablet Take 1  tablet (4 mg total) by mouth every 8 (eight) hours as needed for nausea or vomiting.  Marland Kitchen oxyCODONE (ROXICODONE) 5 MG immediate release tablet Take 1 tablet (5 mg total) by mouth every 4 (four) hours as needed for severe pain.  . potassium chloride (KLOR-CON) 10 MEQ tablet Take 10 mEq by mouth daily as needed (with lasix (fluid/swelling)).   . [DISCONTINUED] atenolol (TENORMIN) 50 MG tablet Take 25 mg by mouth at bedtime.      Allergies:   Codeine, Hydrocodone, Penicillins, and Sulfa antibiotics   Social History   Socioeconomic History  . Marital status: Widowed    Spouse name: Not on file  . Number of children: 1  . Years of education: 45  . Highest education level: Not on file  Occupational History  . Not on file  Tobacco Use  . Smoking status: Never Smoker  . Smokeless tobacco: Never Used  Vaping Use  . Vaping Use: Never used  Substance and Sexual Activity  . Alcohol use: No  . Drug use: No  . Sexual activity: Not on file  Other Topics Concern  . Not on file  Social History Narrative  . Not on file   Social Determinants of Health   Financial Resource Strain:   . Difficulty of Paying Living Expenses: Not on file  Food Insecurity:   . Worried About Charity fundraiser in the Last Year: Not on file  . Ran Out of Food in the Last Year: Not on file  Transportation Needs:   . Lack of Transportation (Medical): Not on file  . Lack of Transportation (Non-Medical): Not on file  Physical Activity:   . Days of Exercise per Week: Not on file  . Minutes of Exercise per Session: Not on file  Stress:   . Feeling of Stress : Not on file  Social Connections:   . Frequency of Communication with Friends and Family: Not on file  . Frequency of Social Gatherings with Friends and Family: Not on file  . Attends Religious Services: Not on file  . Active Member of Clubs or Organizations: Not on file  . Attends Archivist Meetings: Not on file  . Marital Status: Not on file      Family History: The patient's family history includes Dementia in her mother; Fibromyalgia in her sister; Heart disease in her brother and father; Hypertension in her brother. There is no history of Colon cancer, Colon polyps, Esophageal cancer, Rectal cancer, or Stomach cancer.  ROS:   Please see the history of present illness.     All other systems reviewed and are negative.  EKGs/Labs/Other Studies Reviewed:    The following studies were reviewed today:   EKG:  EKG is not ordered today.  The ekg ordered  at prior clinic visit demonstrates normal sinus rhythm, rate 60, borderline LVH, poor R wave progression  Recent Labs: 11/27/2019: ALT 16; BUN 14; Creatinine, Ser 0.64; Hemoglobin 15.0; Platelets 274; Potassium 4.1; Sodium 139  Recent Lipid Panel    Component Value Date/Time   CHOL  11/24/2006 2155    164        ATP III CLASSIFICATION:  <200     mg/dL   Desirable  200-239  mg/dL   Borderline High  >=240    mg/dL   High   TRIG 150 (H) 11/24/2006 2155   HDL 48 11/24/2006 2155   CHOLHDL 3.4 11/24/2006 2155   VLDL 30 11/24/2006 2155   [place]  11/24/2006 2155    86        Total Cholesterol/HDL:CHD Risk Coronary Heart Disease Risk Table                     Men   Women  1/2 Average Risk   3.4   3.3    Physical Exam:    VS:  BP (!) 146/70   Pulse 71   Ht 5\' 1"  (1.549 m)   Wt 175 lb (79.4 kg)   SpO2 97%   BMI 33.07 kg/m     Wt Readings from Last 3 Encounters:  03/12/20 175 lb (79.4 kg)  12/06/19 175 lb 7 oz (79.6 kg)  11/27/19 175 lb 7 oz (79.6 kg)     GEN: in no acute distress HEENT: Normal NECK: No JVD; No carotid bruits CARDIAC: RRR, no murmurs, rubs, gallops RESPIRATORY:  Clear to auscultation without rales, wheezing or rhonchi  ABDOMEN: Soft, non-tender, non-distended MUSCULOSKELETAL:  No edema; No deformity  SKIN: Warm and dry NEUROLOGIC:  Alert and oriented x 3 PSYCHIATRIC:  Normal affect   ASSESSMENT:    1. Leg edema   2. Essential hypertension     PLAN:     Left lower extremity edema: Asymmetric.  Lower extremity duplex on 11/01/2019 showed no evidence of DVT.  Echocardiogram on 11/20/2019 showed normal biventricular function, no significant valvular disease.  Hypertension: On atenolol 25 mg daily and benazepril 20 mg daily.  BP elevated, will increase atenolol to 50 mg daily, which is what she was taking prior to her surgery.  Asked patient to check BP daily and call in next 2 weeks with results.  Type 2 diabetes: A1c is 7.3.  She is on [place] and Metformin  RTC in 1 year   Medication Adjustments/Labs and Tests Ordered: Current medicines are reviewed at length with the patient today.  Concerns regarding medicines are outlined above.  No orders of the defined types were placed in this encounter.  Meds ordered this encounter  Medications  . atenolol (TENORMIN) 50 MG tablet    Sig: Take 1 tablet (50 mg total) by mouth at bedtime.    Dispense:  90 tablet    Refill:  3    Patient Instructions  Medication Instructions:  INCREASE atenolol to 50 mg daily  *If you need a refill on your cardiac medications before your next appointment, please call your pharmacy*  Follow-Up: At [facility], you and your health needs are our priority.  As part of our continuing mission to provide you with exceptional heart care, we have created designated Provider Care Teams.  These Care Teams include your primary Cardiologist (physician) and Advanced Practice Providers (APPs -  Physician Assistants and Nurse Practitioners) who all work together to provide you with the care  you need, when you need it.  We recommend signing up for the patient portal called "MyChart".  Sign up information is provided on this After Visit Summary.  MyChart is used to connect with patients for Virtual Visits (Telemedicine).  Patients are able to view lab/test results, encounter notes, upcoming appointments, etc.  Non-urgent messages can be sent to your provider as well.    To learn more about what you can do with MyChart, go to [email or web address].    Your next appointment:   12 month(s)  The format for your next appointment:   In Person  Provider:   Oswaldo Milian, MD   Other Instructions Please check your blood pressure at home daily, write it down.  Call the office or send message via Mychart with the readings in 2 weeks for Dr. Gardiner Rhyme to review.       Signed, Donato Heinz, MD  03/12/2020 1:45 PM    [facility]

## 2020-03-12 ENCOUNTER — Ambulatory Visit: Payer: Medicare Other | Admitting: Cardiology

## 2020-03-12 ENCOUNTER — Encounter: Payer: Self-pay | Admitting: Cardiology

## 2020-03-12 ENCOUNTER — Other Ambulatory Visit: Payer: Self-pay

## 2020-03-12 VITALS — BP 146/70 | HR 71 | Ht 61.0 in | Wt 175.0 lb

## 2020-03-12 DIAGNOSIS — I10 Essential (primary) hypertension: Secondary | ICD-10-CM

## 2020-03-12 DIAGNOSIS — R600 Localized edema: Secondary | ICD-10-CM

## 2020-03-12 MED ORDER — ATENOLOL 50 MG PO TABS: 50.0000 mg | ORAL_TABLET | Freq: Every day | ORAL | 3 refills | Status: DC

## 2020-03-12 NOTE — Patient Instructions (Signed)
Medication Instructions:  INCREASE atenolol to 50 mg daily  *If you need a refill on your cardiac medications before your next appointment, please call your pharmacy*  Follow-Up: At [facility], you and your health needs are our priority.  As part of our continuing mission to provide you with exceptional heart care, we have created designated Provider Care Teams.  These Care Teams include your primary Cardiologist (physician) and Advanced Practice Providers (APPs -  Physician Assistants and Nurse Practitioners) who all work together to provide you with the care you need, when you need it.  We recommend signing up for the patient portal called "MyChart".  Sign up information is provided on this After Visit Summary.  MyChart is used to connect with patients for Virtual Visits (Telemedicine).  Patients are able to view lab/test results, encounter notes, upcoming appointments, etc.  Non-urgent messages can be sent to your provider as well.   To learn more about what you can do with MyChart, go to [email or web address].    Your next appointment:   12 month(s)  The format for your next appointment:   In Person  Provider:   Oswaldo Milian, MD   Other Instructions Please check your blood pressure at home daily, write it down.  Call the office or send message via Mychart with the readings in 2 weeks for Dr. Gardiner Rhyme to review.

## 2020-04-25 DIAGNOSIS — Z96659 Presence of unspecified artificial knee joint: Secondary | ICD-10-CM | POA: Insufficient documentation

## 2020-06-08 HISTORY — PX: CARPAL TUNNEL RELEASE: SHX101

## 2020-08-27 DIAGNOSIS — M7052 Other bursitis of knee, left knee: Secondary | ICD-10-CM | POA: Insufficient documentation

## 2020-10-01 ENCOUNTER — Other Ambulatory Visit: Payer: Self-pay | Admitting: Internal Medicine

## 2020-10-01 ENCOUNTER — Ambulatory Visit
Admission: RE | Admit: 2020-10-01 | Discharge: 2020-10-01 | Disposition: A | Discharge status: Home / Self Care | Payer: Medicare Other | Source: Ambulatory Visit | Attending: Internal Medicine | Admitting: Internal Medicine

## 2020-10-01 DIAGNOSIS — R1032 Left lower quadrant pain: Secondary | ICD-10-CM

## 2020-10-01 MED ORDER — IOPAMIDOL (ISOVUE-300) INJECTION 61%
100.0000 mL | Freq: Once | INTRAVENOUS | Status: AC | PRN
  Administered 2020-10-01: 100 mL via INTRAVENOUS

## 2021-01-16 DIAGNOSIS — G5601 Carpal tunnel syndrome, right upper limb: Secondary | ICD-10-CM | POA: Insufficient documentation

## 2021-04-05 ENCOUNTER — Other Ambulatory Visit: Payer: Self-pay | Admitting: Cardiology

## 2021-04-30 ENCOUNTER — Other Ambulatory Visit: Payer: Self-pay | Admitting: Cardiology

## 2021-04-30 NOTE — Telephone Encounter (Signed)
This is Dr. Schumann's pt 

## 2021-05-24 ENCOUNTER — Other Ambulatory Visit: Payer: Self-pay | Admitting: Cardiology

## 2021-06-02 ENCOUNTER — Other Ambulatory Visit: Payer: Self-pay | Admitting: Cardiology

## 2021-09-18 ENCOUNTER — Other Ambulatory Visit: Payer: Self-pay | Admitting: Cardiology

## 2021-10-31 ENCOUNTER — Other Ambulatory Visit: Payer: Self-pay | Admitting: Cardiology

## 2021-11-26 ENCOUNTER — Other Ambulatory Visit: Payer: Self-pay | Admitting: Cardiology

## 2021-12-22 ENCOUNTER — Other Ambulatory Visit: Payer: Self-pay | Admitting: Cardiology

## 2022-01-18 ENCOUNTER — Other Ambulatory Visit: Payer: Self-pay | Admitting: Cardiology

## 2022-01-23 ENCOUNTER — Encounter: Payer: Self-pay | Admitting: Gastroenterology

## 2022-02-02 ENCOUNTER — Encounter: Payer: Self-pay | Admitting: Gastroenterology

## 2022-02-06 ENCOUNTER — Ambulatory Visit: Payer: Medicare Other

## 2022-02-06 VITALS — Ht 61.0 in | Wt 184.0 lb

## 2022-02-06 DIAGNOSIS — Z86010 Personal history of colonic polyps: Secondary | ICD-10-CM

## 2022-02-06 MED ORDER — NA SULFATE-K SULFATE-MG SULF 17.5-3.13-1.6 GM/177ML PO SOLN: 1.0000 | ORAL | 0 refills | Status: DC

## 2022-02-06 NOTE — Progress Notes (Signed)
No egg or soy allergy known to patient  No issues known to pt with past sedation with any surgeries or procedures Patient denies ever being told they had issues or difficulty with intubation  No FH of Malignant Hyperthermia Pt is not on diet pills Pt is not on  home 02  Pt is not on blood thinners  Pt denies issues with constipation  No A fib or A flutter Have any cardiac testing pending--denied Pt instructed to use [email or web address] or GoodRx for a price reduction on prep   

## 2022-02-10 ENCOUNTER — Encounter: Payer: Self-pay | Admitting: Gastroenterology

## 2022-02-25 ENCOUNTER — Telehealth: Payer: Self-pay

## 2022-02-25 NOTE — Telephone Encounter (Signed)
error 

## 2022-02-25 NOTE — Telephone Encounter (Signed)
Left message for patient to call back to discuss if she can arrive for colonoscopy on 02/27/22 at 11:00 for noon colonoscopy.

## 2022-02-27 ENCOUNTER — Ambulatory Visit (AMBULATORY_SURGERY_CENTER): Payer: Medicare Other | Admitting: Gastroenterology

## 2022-02-27 ENCOUNTER — Encounter: Payer: Self-pay | Admitting: Gastroenterology

## 2022-02-27 VITALS — BP 152/6 | HR 51 | Temp 95.9°F | Resp 12 | Ht 61.0 in | Wt 184.0 lb

## 2022-02-27 DIAGNOSIS — Z86010 Personal history of colonic polyps: Secondary | ICD-10-CM

## 2022-02-27 DIAGNOSIS — D125 Benign neoplasm of sigmoid colon: Secondary | ICD-10-CM | POA: Diagnosis not present

## 2022-02-27 DIAGNOSIS — R194 Change in bowel habit: Secondary | ICD-10-CM

## 2022-02-27 DIAGNOSIS — R197 Diarrhea, unspecified: Secondary | ICD-10-CM | POA: Diagnosis not present

## 2022-02-27 MED ORDER — SODIUM CHLORIDE 0.9 % IV SOLN: 500.0000 mL | Freq: Once | INTRAVENOUS | Status: DC

## 2022-02-27 NOTE — Patient Instructions (Signed)
Handout on polyp's given to patient. Await pathology results.  Resume previous diet and continue present medications - recommended to add a daily fiber supplement like Metamucil, Citrucel, or Fibercon.   Repeat colonoscopy for surveillance will be determined based off of pathology results.   YOU HAD AN ENDOSCOPIC PROCEDURE TODAY AT [facility]:   Refer to the procedure report that was given to you for any specific questions about what was found during the examination.  If the procedure report does not answer your questions, please call your gastroenterologist to clarify.  If you requested that your care partner not be given the details of your procedure findings, then the procedure report has been included in a sealed envelope for you to review at your convenience later.  YOU SHOULD EXPECT: Some feelings of bloating in the abdomen. Passage of more gas than usual.  Walking can help get rid of the air that was put into your GI tract during the procedure and reduce the bloating. If you had a lower endoscopy (such as a colonoscopy or flexible sigmoidoscopy) you may notice spotting of blood in your stool or on the toilet paper. If you underwent a bowel prep for your procedure, you may not have a normal bowel movement for a few days.  Please Note:  You might notice some irritation and congestion in your nose or some drainage.  This is from the oxygen used during your procedure.  There is no need for concern and it should clear up in a day or so.  SYMPTOMS TO REPORT IMMEDIATELY:  Following lower endoscopy (colonoscopy or flexible sigmoidoscopy):  Excessive amounts of blood in the stool  Significant tenderness or worsening of abdominal pains  Swelling of the abdomen that is new, acute  Fever of 100F or higher  For urgent or emergent issues, a gastroenterologist can be reached at any hour by calling [phone]. Do not use MyChart messaging for urgent concerns.    DIET:  We do  recommend a small meal at first, but then you may proceed to your regular diet.  Drink plenty of fluids but you should avoid alcoholic beverages for 24 hours.  ACTIVITY:  You should plan to take it easy for the rest of today and you should NOT DRIVE or use heavy machinery until tomorrow (because of the sedation medicines used during the test).    FOLLOW UP: Our staff will call the number listed on your records the next business day following your procedure.  We will call around 7:15- 8:00 am to check on you and address any questions or concerns that you may have regarding the information given to you following your procedure. If we do not reach you, we will leave a message.     If any biopsies were taken you will be contacted by phone or by letter within the next 1-3 weeks.  Please call us at [phone] if you have not heard about the biopsies in 3 weeks.    SIGNATURES/CONFIDENTIALITY: You and/or your care partner have signed paperwork which will be entered into your electronic medical record.  These signatures attest to the fact that that the information above on your After Visit Summary has been reviewed and is understood.  Full responsibility of the confidentiality of this discharge information lies with you and/or your care-partner.

## 2022-02-27 NOTE — Progress Notes (Signed)
Called to room to assist during endoscopic procedure.  Patient ID and intended procedure confirmed with present staff. Received instructions for my participation in the procedure from the performing physician.  

## 2022-02-27 NOTE — Progress Notes (Signed)
GASTROENTEROLOGY PROCEDURE H&P NOTE   Primary Care Physician: Crist Infante, MD    Reason for Procedure:  Colon polyp surveillance and Cancer screening  Plan:    Colonoscopy  Patient is appropriate for endoscopic procedure(s) in the ambulatory ([facility]) setting.  The nature of the procedure, as well as the risks, benefits, and alternatives were carefully and thoroughly reviewed with the patient. Ample time for discussion and questions allowed. The patient understood, was satisfied, and agreed to proceed.     HPI: [name] [name] is a 76 y.o. female who presents for colonoscopy for contineiud polyp surveillance.  No active GI symptoms.    Past Medical History:  Diagnosis Date   Anxiety    occ   DDD (degenerative disc disease)    DJD (degenerative joint disease)    DM type 2 (diabetes mellitus, type 2) (HCC)    GERD (gastroesophageal reflux disease)    Hyperlipidemia    Hypertension    Hypothyroidism    Pancreatitis    h/o, twice    Past Surgical History:  Procedure Laterality Date   BREAST CYST EXCISION     left, benign    CARPAL TUNNEL RELEASE Right 2022   COLONOSCOPY     KNEE ARTHROPLASTY Left 12/06/2019   Procedure: COMPUTER ASSISTED TOTAL KNEE ARTHROPLASTY;  Surgeon: Rod Can, MD;  Location: [facility];  Service: Orthopedics;  Laterality: Left;   LUMBAR LAMINECTOMY N/A 04/21/2013   Procedure: CENTRAL MICRODISCECTOMY AND COMPLETE DECOMPRESSION OF LUMBAR LAMINECTOMY L4-L5 ;  Surgeon: Tobi Bastos, MD;  Location: [facility];  Service: Orthopedics;  Laterality: N/A;   POLYPECTOMY     ROTATOR CUFF REPAIR  1995   right   TOTAL ABDOMINAL HYSTERECTOMY  1984    Prior to Admission medications   Medication Sig Start Date End Date Taking? Authorizing Provider  ascorbic acid (GNP VITAMIN C) 500 MG tablet Take 500 mg by mouth daily.   Yes [provider]  atenolol (TENORMIN) 50 MG tablet TAKE 1 TABLET BY MOUTH AT BEDTIME. NEED OFFICE VISIT 01/19/22  Yes  Donato Heinz, MD  atorvastatin (LIPITOR) 10 MG tablet Take 10 mg by mouth daily. 09/12/18  Yes [provider]  benazepril (LOTENSIN) 20 MG tablet Take 20 mg by mouth at bedtime.  12/28/12  Yes [provider]  Calcium-Magnesium-Zinc (CAL-MAG-ZINC PO) Take 1 tablet by mouth daily.   Yes [provider]  cholecalciferol (VITAMIN D) 25 MCG (1000 UNIT) tablet Take 1,000 Units by mouth daily.   Yes [provider]  Cyanocobalamin (VITAMIN B-12) 2500 MCG SUBL Place 2,500 mcg under the tongue daily.   Yes [provider]  FARXIGA 10 MG TABS tablet Take 10 mg by mouth daily. 11/02/18  Yes [provider]  furosemide (LASIX) 20 MG tablet Take 20 mg by mouth daily as needed (swelling/fluid retention.).  11/21/18  Yes [provider]  glucose blood test strip USE TO TEST BLOOD SUGAR ONCE DAILY 07/31/18  Yes [provider]  levothyroxine (SYNTHROID, LEVOTHROID) 88 MCG tablet Take 88 mcg by mouth daily before breakfast.   Yes [provider]  metFORMIN (GLUCOPHAGE-XR) 500 MG 24 hr tablet Take 500-1,000 mg by mouth See admin instructions. Take 2 tablets (1000 mg) by mouth in the morning & take 1 tablet (500 mg) by mouth at night. 12/27/12  Yes [provider]  omeprazole (PRILOSEC) 40 MG capsule Take 40 mg by mouth daily.  10/28/18  Yes [provider]  potassium chloride (KLOR-CON) 10  MEQ tablet Take 10 mEq by mouth daily as needed (with lasix (fluid/swelling)).    Yes [provider]  pramipexole (MIRAPEX) 0.125 MG tablet Take 0.125-0.25 mg by mouth at bedtime as needed. 01/10/22  Yes [provider]  pregabalin (LYRICA) 25 MG capsule TAKE 1 CAPSULE ORALLY TWICE A DAY AS NEEDED FOR NUMBNESS 30 DAYS   Yes [provider]  TOUJEO SOLOSTAR 300 UNIT/ML Solostar Pen SMARTSIG:25 Unit(s) SUB-Q Every Evening 12/24/21  Yes [provider]  naproxen sodium (ALEVE) 220 MG tablet Take  220-440 mg by mouth 2 (two) times daily as needed (pain.). Patient not taking: Reported on 02/06/2022    [provider]  ondansetron (ZOFRAN) 4 MG tablet Take 1 tablet (4 mg total) by mouth every 8 (eight) hours as needed for nausea or vomiting. Patient not taking: Reported on 02/06/2022 12/06/19   Rod Can, MD  oxyCODONE (ROXICODONE) 5 MG immediate release tablet Take 1 tablet (5 mg total) by mouth every 4 (four) hours as needed for severe pain. Patient not taking: Reported on 02/06/2022 12/06/19   Rod Can, MD    Current Outpatient Medications  Medication Sig Dispense Refill   ascorbic acid (GNP VITAMIN C) 500 MG tablet Take 500 mg by mouth daily.     atenolol (TENORMIN) 50 MG tablet TAKE 1 TABLET BY MOUTH AT BEDTIME. NEED OFFICE VISIT 90 tablet 0   atorvastatin (LIPITOR) 10 MG tablet Take 10 mg by mouth daily.     benazepril (LOTENSIN) 20 MG tablet Take 20 mg by mouth at bedtime.      Calcium-Magnesium-Zinc (CAL-MAG-ZINC PO) Take 1 tablet by mouth daily.     cholecalciferol (VITAMIN D) 25 MCG (1000 UNIT) tablet Take 1,000 Units by mouth daily.     Cyanocobalamin (VITAMIN B-12) 2500 MCG SUBL Place 2,500 mcg under the tongue daily.     FARXIGA 10 MG TABS tablet Take 10 mg by mouth daily.     furosemide (LASIX) 20 MG tablet Take 20 mg by mouth daily as needed (swelling/fluid retention.).      glucose blood test strip USE TO TEST BLOOD SUGAR ONCE DAILY     levothyroxine (SYNTHROID, LEVOTHROID) 88 MCG tablet Take 88 mcg by mouth daily before breakfast.     metFORMIN (GLUCOPHAGE-XR) 500 MG 24 hr tablet Take 500-1,000 mg by mouth See admin instructions. Take 2 tablets (1000 mg) by mouth in the morning & take 1 tablet (500 mg) by mouth at night.     omeprazole (PRILOSEC) 40 MG capsule Take 40 mg by mouth daily.      potassium chloride (KLOR-CON) 10 MEQ tablet Take 10 mEq by mouth daily as needed (with lasix (fluid/swelling)).      pramipexole (MIRAPEX) 0.125 MG tablet Take  0.125-0.25 mg by mouth at bedtime as needed.     pregabalin (LYRICA) 25 MG capsule TAKE 1 CAPSULE ORALLY TWICE A DAY AS NEEDED FOR NUMBNESS 30 DAYS     TOUJEO SOLOSTAR 300 UNIT/ML Solostar Pen SMARTSIG:25 Unit(s) SUB-Q Every Evening     naproxen sodium (ALEVE) 220 MG tablet Take 220-440 mg by mouth 2 (two) times daily as needed (pain.). (Patient not taking: Reported on 02/06/2022)     ondansetron (ZOFRAN) 4 MG tablet Take 1 tablet (4 mg total) by mouth every 8 (eight) hours as needed for nausea or vomiting. (Patient not taking: Reported on 02/06/2022) 20 tablet 0   oxyCODONE (ROXICODONE) 5 MG immediate release tablet Take 1 tablet (5 mg total) by mouth every 4 (  four) hours as needed for severe pain. (Patient not taking: Reported on 02/06/2022) 40 tablet 0   Current Facility-Administered Medications  Medication Dose Route Frequency Provider Last Rate Last Admin   0.9 %  sodium chloride infusion  500 mL Intravenous Once Susen Haskew V, DO        Allergies as of 02/27/2022 - Review Complete 02/27/2022  Allergen Reaction Noted   Codeine Itching 12/07/2018   Hydrocodone Itching 04/11/2013   Penicillins Itching    Sulfa antibiotics Hives and Itching 12/07/2018    Family History  Problem Relation Age of Onset   Dementia Mother        age 42 in 2014, heart disease   Heart disease Father        CAD, CABG   Heart disease Brother        HTN, CBAG   Hypertension Brother    Fibromyalgia Sister    Colon cancer Neg Hx    Colon polyps Neg Hx    Esophageal cancer Neg Hx    Rectal cancer Neg Hx    Stomach cancer Neg Hx     Social History   Socioeconomic History   Marital status: Widowed    Spouse name: Not on file   Number of children: 1   Years of education: 14   Highest education level: Not on file  Occupational History   Not on file  Tobacco Use   Smoking status: Never   Smokeless tobacco: Never  Vaping Use   Vaping Use: Never used  Substance and Sexual Activity   Alcohol use: No    Drug use: No   Sexual activity: Not on file  Other Topics Concern   Not on file  Social History Narrative   Not on file   Social Determinants of Health   Financial Resource Strain: Not on file  Food Insecurity: Not on file  Transportation Needs: Not on file  Physical Activity: Not on file  Stress: Not on file  Social Connections: Not on file  Intimate Partner Violence: Not on file    Physical Exam: Vital signs in last 24 hours: '@BP'$  (!) 186/79   Pulse 60   Temp (!) 95.9 F (35.5 C) (Temporal)   Resp 10   Ht '5\' 1"'$  (1.549 m)   Wt 184 lb (83.5 kg)   SpO2 97%   BMI 34.77 kg/m  GEN: NAD EYE: Sclerae anicteric ENT: MMM CV: Non-tachycardic Pulm: CTA b/l GI: Soft, NT/ND NEURO:  Alert & Oriented x 3   Gerrit Heck, DO [facility]   02/27/2022 12:08 PM

## 2022-02-27 NOTE — Progress Notes (Signed)
VS completed by DT.  Pt's states no medical or surgical changes since previsit or office visit.  

## 2022-02-27 NOTE — Progress Notes (Signed)
A and O x3. Report to RN. Tolerated MAC anesthesia well. 

## 2022-02-27 NOTE — Op Note (Signed)
[place] Patient Name: [name] [name] Procedure Date: 02/27/2022 12:05 PM MRN: [id number] Endoscopist: Gerrit Heck , MD Age: 76 Referring MD:  Date of Birth: 1946/06/04 Gender: Female Account #: [id number] Procedure:                Colonoscopy Indications:              Surveillance: Personal history of adenomatous                            polyps on last colonoscopy 3 years ago. Last                            colonoscopy as 12/2018 and notable for                            transverse/ascending colon adenomas, along with 3                            sigmoid hyperplastic polyps, including an 11                            sigmoid HP.                           Separately, she has had recent change in bowel                            habits with diarrhea and loose, non-bloody stools. Medicines:                Monitored Anesthesia Care Procedure:                Pre-Anesthesia Assessment:                           - Prior to the procedure, a History and Physical                            was performed, and patient medications and                            allergies were reviewed. The patient's tolerance of                            previous anesthesia was also reviewed. The risks                            and benefits of the procedure and the sedation                            options and risks were discussed with the patient.                            All questions were answered, and informed consent  was obtained. Prior Anticoagulants: The patient has                            taken no previous anticoagulant or antiplatelet                            agents. ASA Grade Assessment: III - A patient with                            severe systemic disease. After reviewing the risks                            and benefits, the patient was deemed in                            satisfactory condition to undergo the procedure.                            After obtaining informed consent, the colonoscope                            was passed under direct vision. Throughout the                            procedure, the patient's blood pressure, pulse, and                            oxygen saturations were monitored continuously. The                            CF [id number] #[id number] was introduced through the anus                            and advanced to the the cecum, identified by                            appendiceal orifice and ileocecal valve. The                            colonoscopy was performed without difficulty. The                            patient tolerated the procedure well. The quality                            of the bowel preparation was good. The ileocecal                            valve, appendiceal orifice, and rectum were                            photographed. Scope In: 12:16:57 PM Scope Out: 12:33:09 PM Scope Withdrawal Time: 0 hours 12 minutes 59 seconds  Total Procedure Duration: 0 hours  16 minutes 12 seconds  Findings:                 The perianal and digital rectal examinations were                            normal.                           A 5 mm polyp was found in the sigmoid colon. The                            polyp was sessile. The polyp was removed with a                            cold snare. Resection and retrieval were complete.                            Estimated blood loss was minimal.                           Normal mucosa was found in the entire colon.                            Biopsies for histology were taken with a cold                            forceps from the right colon and left colon for                            evaluation of microscopic colitis. Estimated blood                            loss was minimal.                           The retroflexed view of the distal rectum and anal                            verge was normal and showed no anal or rectal                             abnormalities. Complications:            No immediate complications. Estimated Blood Loss:     Estimated blood loss was minimal. Impression:               - One 5 mm polyp in the sigmoid colon, removed with                            a cold snare. Resected and retrieved.                           - Normal mucosa in the entire examined colon.  Biopsied.                           - The distal rectum and anal verge are normal on                            retroflexion view. Recommendation:           - Patient has a contact number available for                            emergencies. The signs and symptoms of potential                            delayed complications were discussed with the                            patient. Return to normal activities tomorrow.                            Written discharge instructions were provided to the                            patient.                           - Resume previous diet.                           - Continue present medications.                           - Await pathology results.                           - Repeat colonoscopy for surveillance based on                            pathology results.                           - Return to GI office PRN.                           - Use fiber, for example Citrucel, Fibercon, Konsyl                            or Metamucil. Gerrit Heck, MD 02/27/2022 12:40:28 PM

## 2022-03-02 ENCOUNTER — Telehealth: Payer: Self-pay | Admitting: *Deleted

## 2022-03-02 NOTE — Telephone Encounter (Signed)
  Follow up Call-     02/27/2022   11:14 AM  Call back number  Post procedure Call Back phone  # [phone]  Permission to leave phone message Yes     Patient questions:  Do you have a fever, pain , or abdominal swelling? No. Pain Score  0 *  Have you tolerated food without any problems? Yes.    Have you been able to return to your normal activities? Yes.    Do you have any questions about your discharge instructions: Diet   No. Medications  No. Follow up visit  No.  Do you have questions or concerns about your Care? No.  Actions: * If pain score is 4 or above: No action needed, pain <4.

## 2022-03-11 ENCOUNTER — Encounter: Payer: Self-pay | Admitting: Gastroenterology

## 2022-04-13 ENCOUNTER — Other Ambulatory Visit: Payer: Self-pay | Admitting: Cardiology

## 2022-04-28 ENCOUNTER — Other Ambulatory Visit: Payer: Self-pay | Admitting: Cardiology

## 2022-05-08 ENCOUNTER — Other Ambulatory Visit: Payer: Self-pay | Admitting: Cardiology

## 2022-05-28 ENCOUNTER — Encounter: Payer: Self-pay | Admitting: Podiatry

## 2022-05-28 ENCOUNTER — Ambulatory Visit (INDEPENDENT_AMBULATORY_CARE_PROVIDER_SITE_OTHER): Payer: Medicare Other | Admitting: Podiatry

## 2022-05-28 VITALS — BP 165/72 | HR 54

## 2022-05-28 DIAGNOSIS — M79676 Pain in unspecified toe(s): Secondary | ICD-10-CM

## 2022-05-28 DIAGNOSIS — L600 Ingrowing nail: Secondary | ICD-10-CM

## 2022-05-28 DIAGNOSIS — B351 Tinea unguium: Secondary | ICD-10-CM | POA: Diagnosis not present

## 2022-05-28 MED ORDER — NEOMYCIN-POLYMYXIN-HC 1 % OT SOLN: OTIC | 1 refills | Status: AC

## 2022-05-28 NOTE — Patient Instructions (Signed)

## 2022-05-28 NOTE — Progress Notes (Signed)
Subjective:  Patient ID: [name] [name], female    DOB: Feb 26, 1946,  MRN: [id number] HPI Chief Complaint  Patient presents with   Toe Pain    Hallux left - medial border, tender, worse if walking a lot with shoes on   2nd toe right - corn, also thinks has ingrown to lateral border of hallux right due to rubbing   New Patient (Initial Visit)    Est pt 2018   Diabetes    Last A1c was 7.2    76 y.o. female presents with the above complaint.   ROS: Denies fever chills nausea vomit muscle aches pains calf pain back pain chest pain shortness of breath.  Past Medical History:  Diagnosis Date   Anxiety    occ   DDD (degenerative disc disease)    DJD (degenerative joint disease)    DM type 2 (diabetes mellitus, type 2) (HCC)    GERD (gastroesophageal reflux disease)    Hyperlipidemia    Hypertension    Hypothyroidism    Pancreatitis    h/o, twice   Past Surgical History:  Procedure Laterality Date   BREAST CYST EXCISION     left, benign    CARPAL TUNNEL RELEASE Right 2022   COLONOSCOPY     KNEE ARTHROPLASTY Left 12/06/2019   Procedure: COMPUTER ASSISTED TOTAL KNEE ARTHROPLASTY;  Surgeon: Rod Can, MD;  Location: [facility];  Service: Orthopedics;  Laterality: Left;   LUMBAR LAMINECTOMY N/A 04/21/2013   Procedure: CENTRAL MICRODISCECTOMY AND COMPLETE DECOMPRESSION OF LUMBAR LAMINECTOMY L4-L5 ;  Surgeon: Tobi Bastos, MD;  Location: [facility];  Service: Orthopedics;  Laterality: N/A;   POLYPECTOMY     ROTATOR CUFF REPAIR  1995   right   TOTAL ABDOMINAL HYSTERECTOMY  1984    Current Outpatient Medications:    ascorbic acid (GNP VITAMIN C) 500 MG tablet, Take 500 mg by mouth daily., Disp: , Rfl:    atenolol (TENORMIN) 50 MG tablet, Take 1 tablet (50 mg total) by mouth at bedtime. Please call [phone] to schedule an overdue appointment for future refills. Thank you. 2nd attempt., Disp: 15 tablet, Rfl: 0   atorvastatin (LIPITOR) 10 MG tablet, Take 10 mg by mouth  daily., Disp: , Rfl:    benazepril (LOTENSIN) 20 MG tablet, Take 20 mg by mouth at bedtime. , Disp: , Rfl:    Calcium-Magnesium-Zinc (CAL-MAG-ZINC PO), Take 1 tablet by mouth daily., Disp: , Rfl:    cholecalciferol (VITAMIN D) 25 MCG (1000 UNIT) tablet, Take 1,000 Units by mouth daily., Disp: , Rfl:    Cyanocobalamin (VITAMIN B-12) 2500 MCG SUBL, Place 2,500 mcg under the tongue daily., Disp: , Rfl:    estradiol (ESTRACE) 0.5 MG tablet, TAKE 1 TABLET BY MOUTH DAILY for 90 days, Disp: , Rfl:    FARXIGA 10 MG TABS tablet, Take 10 mg by mouth daily., Disp: , Rfl:    furosemide (LASIX) 20 MG tablet, Take 20 mg by mouth daily as needed (swelling/fluid retention.). , Disp: , Rfl:    glucose blood test strip, USE TO TEST BLOOD SUGAR ONCE DAILY, Disp: , Rfl:    levothyroxine (SYNTHROID, LEVOTHROID) 88 MCG tablet, Take 88 mcg by mouth daily before breakfast., Disp: , Rfl:    metFORMIN (GLUCOPHAGE-XR) 500 MG 24 hr tablet, Take 500-1,000 mg by mouth See admin instructions. Take 2 tablets (1000 mg) by mouth in the morning & take 1 tablet (500 mg) by mouth at night., Disp: , Rfl:    omeprazole (PRILOSEC) 40 MG  capsule, Take 40 mg by mouth daily. , Disp: , Rfl:    potassium chloride (KLOR-CON) 10 MEQ tablet, Take 10 mEq by mouth daily as needed (with lasix (fluid/swelling)). , Disp: , Rfl:    pramipexole (MIRAPEX) 0.125 MG tablet, Take 0.125-0.25 mg by mouth at bedtime as needed., Disp: , Rfl:    pregabalin (LYRICA) 25 MG capsule, TAKE 1 CAPSULE ORALLY TWICE A DAY AS NEEDED FOR NUMBNESS 30 DAYS, Disp: , Rfl:    TOUJEO SOLOSTAR 300 UNIT/ML Solostar Pen, SMARTSIG:25 Unit(s) SUB-Q Every Evening, Disp: , Rfl:   Allergies  Allergen Reactions   Celecoxib     Other Reaction(s): upset stomach   Codeine Itching   Hydrochlorothiazide     Other Reaction(s): we think may have contributed to her first bout of pancreatitis. not sure about the 2010 episode what may have caused that.   Hydrocodone Itching   Penicillins  Itching   Pregabalin     Other Reaction(s): bad edema.   Ropinirole     Other Reaction(s): just didn't help much   Rosuvastatin     Other Reaction(s): Bloating and cramping in legs   Sulfa Antibiotics Hives and Itching   Gabapentin Rash   Review of Systems Objective:   Vitals:   05/28/22 1004  BP: (!) 165/72  Pulse: (!) 54    General: Well developed, nourished, in no acute distress, alert and oriented x3   Dermatological: Skin is warm, dry and supple bilateral. Nails x 10 are well maintained; remaining integument appears unremarkable at this time. There are no open sores, no preulcerative lesions, no rash or signs of infection present.  Sharp incurvated nail margin along the tibial border of the hallux left mild erythema toenail is long to the hallux bilaterally.  She does have a soft area of irritation to the medial aspect of the DIPJ secondary to the right foot secondary to the hammertoe deformity in the juxtaposition of the hallux.  There is no open lesion or wound and no callus to trim.  Vascular: Dorsalis Pedis artery and Posterior Tibial artery pedal pulses are 2/4 bilateral with immedate capillary fill time. Pedal hair growth present. No varicosities and no lower extremity edema present bilateral.   Neruologic: Grossly intact via light touch bilateral. Vibratory intact via tuning fork bilateral. Protective threshold with Semmes Wienstein monofilament intact to all pedal sites bilateral. Patellar and Achilles deep tendon reflexes 2+ bilateral. No Babinski or clonus noted bilateral.   Musculoskeletal: No gross boney pedal deformities bilateral. No pain, crepitus, or limitation noted with foot and ankle range of motion bilateral. Muscular strength 5/5 in all groups tested bilateral.  Gait: Unassisted, Nonantalgic.    Radiographs:  None taken  Assessment & Plan:   Assessment: Ingrown toenail tibial border hallux left.  Painful second toe right foot secondary to the  juxtaposition of the hallux.  Hallux valgus bilateral.  Hammertoe bilateral.  Plan: Discussed etiology pathology conservative versus surgical therapies.  Placed silicone padding to the hallux and the second digit of the right foot.  Debrided the hallux nail right foot.  Chemical matricectomy was performed to the tibial border of the hallux left.  Tolerated procedure well without complications.  She was given both oral written but instruction for the care and soaking of the toe as well as a prescription Cortisporin Otic to be applied twice daily after soaking.  Will follow-up with her in 2 to 3 weeks for reeval.     [name] [name], [place]

## 2022-06-11 ENCOUNTER — Encounter: Payer: Self-pay | Admitting: Podiatry

## 2022-06-11 ENCOUNTER — Ambulatory Visit: Payer: Medicare Other | Admitting: Podiatry

## 2022-06-11 DIAGNOSIS — Z9889 Other specified postprocedural states: Secondary | ICD-10-CM

## 2022-06-11 DIAGNOSIS — L600 Ingrowing nail: Secondary | ICD-10-CM

## 2022-06-11 NOTE — Progress Notes (Signed)
She presents today for nail check hallux left tibial fibular border.  She states that she has been soaking in Betadine and warm water and is feels a whole lot better than it did prior to the procedure.  Objective: Vital signs stable alert oriented x 3.  There is no erythema cellulitis drainage or odor the wound has not gone on to heal 100% as of yet though it is doing very well.  There is no pain on palpation.  Assessment: Well-healing surgical toe.  Plan: Encouraged her to soak it at least once a day and Epsom salts warm water discontinue the Betadine.  Cover during the daytime leave open at night but continue the Cortisporin otic solution at least until it is completed.

## 2023-01-19 ENCOUNTER — Encounter: Payer: Self-pay | Admitting: Neurology

## 2023-01-19 ENCOUNTER — Ambulatory Visit: Payer: Medicare Other | Admitting: Neurology

## 2023-01-19 VITALS — BP 134/78 | Ht 62.0 in | Wt 186.0 lb

## 2023-01-19 DIAGNOSIS — G250 Essential tremor: Secondary | ICD-10-CM | POA: Diagnosis not present

## 2023-01-19 NOTE — Progress Notes (Signed)
Chief Complaint  Patient presents with   New Patient (Initial Visit)    Rm 15, NP, tremors, possible occiptial neuralgia      ASSESSMENT AND PLAN  [name] [name] is a 77 y.o. female   Essential tremor,  Made worse by her right rotator cuff,  Strong family history of such  She decided not to proceed with any treatment,  Normal thyroid functional test,  DIAGNOSTIC DATA (LABS, IMAGING, TESTING) - I reviewed patient records, labs, notes, testing and imaging myself where available.   MEDICAL HISTORY:  [name] [name] is a 77 year old female, seen in request by   her primary care physician Dr. Rodrigo Ran, for evaluation of bilateral hands tremor, initial evaluation was on January 19, 2023,  I reviewed and summarized the referring note.PMHX HTN DM HLD Hypothyrodism Hx of pancreatitis Right CTS release in 2022 Lumbar decompression Right shoulder rotator cuff.  She had long history of bilateral hands intermittent tremor, strong family history of similar tremor, sister and brother at 58s have bilateral hands tremor,  She had severe right shoulder rotator cuff injury, is under pain management, limited range of motion, this has made her right hand tremor much worse than the left, difficulty holding a book, utensils, but there was no major limitations in her daily function from tremor  She denied loss sense of smell, gait abnormality contributed to her knee pain and shoulder pain,   PHYSICAL EXAM:   There were no vitals filed for this visit. Not recorded     There is no height or weight on file to calculate BMI.  PHYSICAL EXAMNIATION:  Gen: NAD, conversant, well nourised, well groomed                     Cardiovascular: Regular rate rhythm, no peripheral edema, warm, nontender. Eyes: Conjunctivae clear without exudates or hemorrhage Neck: Supple, no carotid bruits. Pulmonary: Clear to auscultation bilaterally   NEUROLOGICAL EXAM:  MENTAL  STATUS: Speech/cognition: Awake, alert, oriented to history taking and casual conversation CRANIAL NERVES: CN II: Visual fields are full to confrontation. Pupils are round equal and briskly reactive to light. CN III, IV, VI: extraocular movement are normal. No ptosis. CN V: Facial sensation is intact to light touch CN VII: Face is symmetric with normal eye closure  CN VIII: Hearing is normal to causal conversation. CN IX, X: Phonation is normal. CN XI: Head turning and shoulder shrug are intact  MOTOR: Limited range of motion of right shoulder, mild tremor on spiral drawing, no weakness, no rigidity bradykinesia,  REFLEXES: Reflexes are 2+ and symmetric at the biceps, triceps, knees, and ankles. Plantar responses are flexor.  SENSORY: Intact to light touch, pinprick and vibratory sensation are intact in fingers and toes.  COORDINATION: There is no trunk or limb dysmetria noted.  GAIT/STANCE: Posture is normal. Gait is steady with normal steps,   REVIEW OF SYSTEMS:  Full 14 system review of systems performed and notable only for as above All other review of systems were negative.   ALLERGIES: Allergies  Allergen Reactions   Celecoxib     Other Reaction(s): upset stomach   Codeine Itching   Hydrochlorothiazide     Other Reaction(s): we think may have contributed to her first bout of pancreatitis. not sure about the 2010 episode what may have caused that.   Hydrocodone Itching   Penicillins Itching   Pregabalin     Other Reaction(s): bad edema.   Ropinirole     Other  Reaction(s): just didn't help much   Rosuvastatin     Other Reaction(s): Bloating and cramping in legs   Sulfa Antibiotics Hives and Itching   Gabapentin Rash    HOME MEDICATIONS: Current Outpatient Medications  Medication Sig Dispense Refill   ascorbic acid (GNP VITAMIN C) 500 MG tablet Take 500 mg by mouth daily.     atenolol (TENORMIN) 50 MG tablet Take 1 tablet (50 mg total) by mouth at bedtime.  Please call [phone] to schedule an overdue appointment for future refills. Thank you. 2nd attempt. 15 tablet 0   atorvastatin (LIPITOR) 10 MG tablet Take 10 mg by mouth daily.     benazepril (LOTENSIN) 20 MG tablet Take 20 mg by mouth at bedtime.      Calcium-Magnesium-Zinc (CAL-MAG-ZINC PO) Take 1 tablet by mouth daily.     cholecalciferol (VITAMIN D) 25 MCG (1000 UNIT) tablet Take 1,000 Units by mouth daily.     Cyanocobalamin (VITAMIN B-12) 2500 MCG SUBL Place 2,500 mcg under the tongue daily.     estradiol (ESTRACE) 0.5 MG tablet TAKE 1 TABLET BY MOUTH DAILY for 90 days     FARXIGA 10 MG TABS tablet Take 10 mg by mouth daily.     furosemide (LASIX) 20 MG tablet Take 20 mg by mouth daily as needed (swelling/fluid retention.).      glucose blood test strip USE TO TEST BLOOD SUGAR ONCE DAILY     levothyroxine (SYNTHROID, LEVOTHROID) 88 MCG tablet Take 88 mcg by mouth daily before breakfast.     metFORMIN (GLUCOPHAGE-XR) 500 MG 24 hr tablet Take 500-1,000 mg by mouth See admin instructions. Take 2 tablets (1000 mg) by mouth in the morning & take 1 tablet (500 mg) by mouth at night.     NEOMYCIN-POLYMYXIN-HYDROCORTISONE (CORTISPORIN) 1 % SOLN OTIC solution Apply 1-2 drops to toe BID after soaking 10 mL 1   omeprazole (PRILOSEC) 40 MG capsule Take 40 mg by mouth daily.      potassium chloride (KLOR-CON) 10 MEQ tablet Take 10 mEq by mouth daily as needed (with lasix (fluid/swelling)).      TOUJEO SOLOSTAR 300 UNIT/ML Solostar Pen SMARTSIG:25 Unit(s) SUB-Q Every Evening     pramipexole (MIRAPEX) 0.125 MG tablet Take 0.125-0.25 mg by mouth at bedtime as needed. (Patient not taking: Reported on 01/19/2023)     pregabalin (LYRICA) 25 MG capsule TAKE 1 CAPSULE ORALLY TWICE A DAY AS NEEDED FOR NUMBNESS 30 DAYS (Patient not taking: Reported on 01/19/2023)     No current facility-administered medications for this visit.    PAST MEDICAL HISTORY: Past Medical History:  Diagnosis Date   Anxiety    occ    DDD (degenerative disc disease)    DJD (degenerative joint disease)    DM type 2 (diabetes mellitus, type 2) (HCC)    GERD (gastroesophageal reflux disease)    Hyperlipidemia    Hypertension    Hypothyroidism    Pancreatitis    h/o, twice    PAST SURGICAL HISTORY: Past Surgical History:  Procedure Laterality Date   BREAST CYST EXCISION     left, benign    CARPAL TUNNEL RELEASE Right 2022   COLONOSCOPY     KNEE ARTHROPLASTY Left 12/06/2019   Procedure: COMPUTER ASSISTED TOTAL KNEE ARTHROPLASTY;  Surgeon: Samson Frederic, MD;  Location: [facility];  Service: Orthopedics;  Laterality: Left;   LUMBAR LAMINECTOMY N/A 04/21/2013   Procedure: CENTRAL MICRODISCECTOMY AND COMPLETE DECOMPRESSION OF LUMBAR LAMINECTOMY L4-L5 ;  Surgeon: Jacki Cones, MD;  Location: [facility];  Service: Orthopedics;  Laterality: N/A;   POLYPECTOMY     ROTATOR CUFF REPAIR  1995   right   TOTAL ABDOMINAL HYSTERECTOMY  1984    FAMILY HISTORY: Family History  Problem Relation Age of Onset   Dementia Mother        age 66 in 2014, heart disease   Heart disease Father        CAD, CABG   Heart disease Brother        HTN, CBAG   Hypertension Brother    Fibromyalgia Sister    Colon cancer Neg Hx    Colon polyps Neg Hx    Esophageal cancer Neg Hx    Rectal cancer Neg Hx    Stomach cancer Neg Hx     SOCIAL HISTORY: Social History   Socioeconomic History   Marital status: Widowed    Spouse name: Not on file   Number of children: 1   Years of education: 14   Highest education level: Not on file  Occupational History   Not on file  Tobacco Use   Smoking status: Never   Smokeless tobacco: Never  Vaping Use   Vaping status: Never Used  Substance and Sexual Activity   Alcohol use: No   Drug use: No   Sexual activity: Not on file  Other Topics Concern   Not on file  Social History Narrative   Right handed   Caffeine-1-2 cups daily   Lives alone   Social Determinants of Health   Financial  Resource Strain: Not on file  Food Insecurity: Not on file  Transportation Needs: Not on file  Physical Activity: Not on file  Stress: Not on file  Social Connections: Not on file  Intimate Partner Violence: Not on file      Levert Feinstein, M.D. Ph.D.  [facility] [address], [address] [place], [place] [address] Ph: [phone] Fax: [phone]  CC:  Pincus Badder, FNP [address] [place],  [place] [address]  Rodrigo Ran, MD

## 2023-09-01 ENCOUNTER — Other Ambulatory Visit (HOSPITAL_BASED_OUTPATIENT_CLINIC_OR_DEPARTMENT_OTHER): Payer: Self-pay | Admitting: Family Medicine

## 2023-09-01 ENCOUNTER — Ambulatory Visit (HOSPITAL_BASED_OUTPATIENT_CLINIC_OR_DEPARTMENT_OTHER)
Admission: RE | Admit: 2023-09-01 | Discharge: 2023-09-01 | Disposition: A | Discharge status: Home / Self Care | Source: Ambulatory Visit | Attending: Family Medicine | Admitting: Family Medicine

## 2023-09-01 DIAGNOSIS — M79604 Pain in right leg: Secondary | ICD-10-CM | POA: Insufficient documentation
